# Patient Record
Sex: Female | Born: 1940
Health system: Southern US, Community
[De-identification: ages and names within clinical notes are randomized; demographics above are authoritative.]

## PROBLEM LIST (undated history)

## (undated) DIAGNOSIS — E079 Disorder of thyroid, unspecified: Secondary | ICD-10-CM

## (undated) DIAGNOSIS — I10 Essential (primary) hypertension: Secondary | ICD-10-CM

## (undated) DIAGNOSIS — G4730 Sleep apnea, unspecified: Secondary | ICD-10-CM

## (undated) DIAGNOSIS — E119 Type 2 diabetes mellitus without complications: Secondary | ICD-10-CM

## (undated) HISTORY — DX: Essential (primary) hypertension: I10

## (undated) HISTORY — PX: BREAST LUMPECTOMY: SHX2

## (undated) HISTORY — PX: OTHER SURGICAL HISTORY: SHX169

## (undated) HISTORY — DX: Type 2 diabetes mellitus without complications: E11.9

## (undated) HISTORY — PX: TOTAL ABDOMINAL HYSTERECTOMY: SHX209

## (undated) HISTORY — DX: Sleep apnea, unspecified: G47.30

## (undated) HISTORY — PX: CHOLECYSTECTOMY: SHX55

## (undated) HISTORY — DX: Disorder of thyroid, unspecified: E07.9

---

## 2000-12-01 ENCOUNTER — Encounter: Payer: Self-pay | Admitting: *Deleted

## 2000-12-01 ENCOUNTER — Encounter: Admission: RE | Admit: 2000-12-01 | Discharge: 2000-12-01 | Payer: Self-pay | Admitting: *Deleted

## 2000-12-04 ENCOUNTER — Encounter: Payer: Self-pay | Admitting: *Deleted

## 2000-12-04 ENCOUNTER — Encounter: Admission: RE | Admit: 2000-12-04 | Discharge: 2000-12-04 | Payer: Self-pay | Admitting: *Deleted

## 2000-12-17 ENCOUNTER — Encounter: Payer: Self-pay | Admitting: *Deleted

## 2000-12-17 ENCOUNTER — Encounter: Admission: RE | Admit: 2000-12-17 | Discharge: 2000-12-17 | Payer: Self-pay | Admitting: *Deleted

## 2003-03-28 ENCOUNTER — Ambulatory Visit: Admission: RE | Admit: 2003-03-28 | Discharge: 2003-03-28 | Payer: Self-pay | Admitting: Gynecology

## 2003-04-04 ENCOUNTER — Inpatient Hospital Stay (HOSPITAL_COMMUNITY): Admission: RE | Admit: 2003-04-04 | Discharge: 2003-04-07 | Payer: Self-pay | Admitting: Gynecology

## 2003-04-04 ENCOUNTER — Encounter (INDEPENDENT_AMBULATORY_CARE_PROVIDER_SITE_OTHER): Payer: Self-pay | Admitting: Specialist

## 2003-04-08 ENCOUNTER — Inpatient Hospital Stay (HOSPITAL_COMMUNITY): Admission: AD | Admit: 2003-04-08 | Discharge: 2003-04-13 | Payer: Self-pay | Admitting: Obstetrics & Gynecology

## 2003-04-21 ENCOUNTER — Emergency Department (HOSPITAL_COMMUNITY): Admission: EM | Admit: 2003-04-21 | Discharge: 2003-04-21 | Payer: Self-pay | Admitting: Emergency Medicine

## 2003-05-31 ENCOUNTER — Ambulatory Visit: Admission: RE | Admit: 2003-05-31 | Discharge: 2003-05-31 | Payer: Self-pay | Admitting: Gynecology

## 2003-08-28 ENCOUNTER — Inpatient Hospital Stay (HOSPITAL_COMMUNITY): Admission: AD | Admit: 2003-08-28 | Discharge: 2003-09-01 | Payer: Self-pay | Admitting: *Deleted

## 2003-08-29 ENCOUNTER — Encounter (INDEPENDENT_AMBULATORY_CARE_PROVIDER_SITE_OTHER): Payer: Self-pay | Admitting: *Deleted

## 2003-09-08 ENCOUNTER — Ambulatory Visit (HOSPITAL_COMMUNITY): Admission: RE | Admit: 2003-09-08 | Discharge: 2003-09-08 | Payer: Self-pay | Admitting: Gynecology

## 2003-09-13 ENCOUNTER — Other Ambulatory Visit: Admission: RE | Admit: 2003-09-13 | Discharge: 2003-09-13 | Payer: Self-pay | Admitting: Gynecology

## 2003-09-13 ENCOUNTER — Ambulatory Visit: Admission: RE | Admit: 2003-09-13 | Discharge: 2003-09-13 | Payer: Self-pay | Admitting: Gynecology

## 2003-09-13 ENCOUNTER — Encounter (INDEPENDENT_AMBULATORY_CARE_PROVIDER_SITE_OTHER): Payer: Self-pay | Admitting: *Deleted

## 2004-03-15 ENCOUNTER — Ambulatory Visit: Admission: RE | Admit: 2004-03-15 | Discharge: 2004-03-15 | Payer: Self-pay | Admitting: Gynecology

## 2004-03-15 ENCOUNTER — Encounter (INDEPENDENT_AMBULATORY_CARE_PROVIDER_SITE_OTHER): Payer: Self-pay | Admitting: *Deleted

## 2004-03-15 ENCOUNTER — Other Ambulatory Visit: Admission: RE | Admit: 2004-03-15 | Discharge: 2004-03-15 | Payer: Self-pay | Admitting: Gynecology

## 2004-10-29 ENCOUNTER — Encounter (INDEPENDENT_AMBULATORY_CARE_PROVIDER_SITE_OTHER): Payer: Self-pay | Admitting: Specialist

## 2004-10-29 ENCOUNTER — Ambulatory Visit: Admission: RE | Admit: 2004-10-29 | Discharge: 2004-10-29 | Payer: Self-pay | Admitting: Gynecologic Oncology

## 2004-10-29 ENCOUNTER — Other Ambulatory Visit: Admission: RE | Admit: 2004-10-29 | Discharge: 2004-10-29 | Payer: Self-pay | Admitting: Gynecologic Oncology

## 2005-04-14 IMAGING — CT CT ABDOMEN W/ CM
1 of 4 series · 13 of 32 positions shown, 18 images · IV contrast (omnipaque)
Comparison: none

CLINICAL DATA: Pelvic abscess.  Left hydronephrosis.  Endometrial cancer.  Fever.  
 CT SCAN OF THE ABDOMEN WITH CONTRAST:
 Spiral scanning is performed after oral administration of dilute contrast and during intravenous administration of 150 cc of Omnipaque 300. 
 Lung bases are clear except for minimal atelectasis at the bases, right more than left.  No pleural or pericardial fluid. 
 The liver shows mild fatty change, but there is no focal lesion.  The patient has had cholecystectomy.  Spleen is normal in size, shape and position.  No abnormality of the pancreas is seen.  The adrenal glands are normal.  The right kidney shows slight prominence of the renal collecting system and ureter which is probably nonobstructive.  There is a focal scar in the upper pole.  The left kidney shows moderate hydroureteronephrosis.  The cause of the obstruction is not evident on the abdominal portion of the scan.  No mass of the kidney is seen.  There is no aortic aneurysm.  The patient has had a retroperitoneal node dissection.  I do not see any measurable retroperitoneal lymph nodes.  
 IMPRESSION
 Moderate hydroureteronephrosis on the left of indeterminate etiology based on the abdominal portion of the scan.  See below.
 CT SCAN OF THE PELVIS WITH CONTRAST:
 Spiral scanning is performed after oral and intravenous contrast administration.  
 There is a large left retroperitoneal low density lesion located immediately adjacent to the psoas muscle running just ventral to the ureter from the level or the pelvic brim down to the level of the acetabulum.  This is responsible for the left ureteral obstruction.  This measures 7.5 cm cephalocaudal with a maximal transverse diameter of 8.5 x 6.5 cm.  This is consistent with a psoas or para-psoas abscess.  This displaces the bowel from left to right.  The bladder is displaced from left to right.  Posterior to this, there is a soft tissue density mass-like entity measuring 2.7 x 2.0 cm.  This appears nearly contiguous with the vaginal fornix.  The exact nature of this is uncertain.  It could represent a neoplastic mass.  No free fluid is seen.  No inguinal lymph nodes.  
 7.5 x 8.5 x 6.5 cm left psoas or para-psoas abscess with a mass effect upon the ureter which passes dorsal to this.  The ureter is very closely associated with the posterior margin.  This is presumed to represent an abscess, but could conceivably represent a hematoma, lymphocele, or necrotic tumor.  
 2 x 2.7 cm soft tissue density entity posterior to the psoas lesion and to the left of the sigmoid colon and just above the vaginal fornix on the left.  The exact nature of this is uncertain, but this could represent a malignant lymph node or tumor deposit.

[Series 2: abd/pelvis 5.0 b30f · axial · 0.74mm/px · z∈[-206,+220]mm · 13 of 97 slices shown, 18 images]
[im 6/97  soft-tissue]
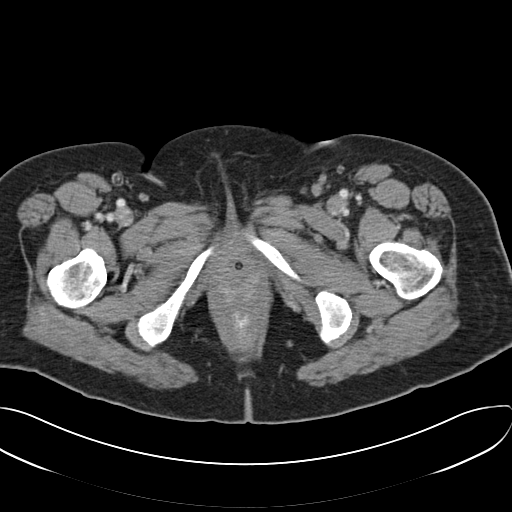
[im 6/97  bone]
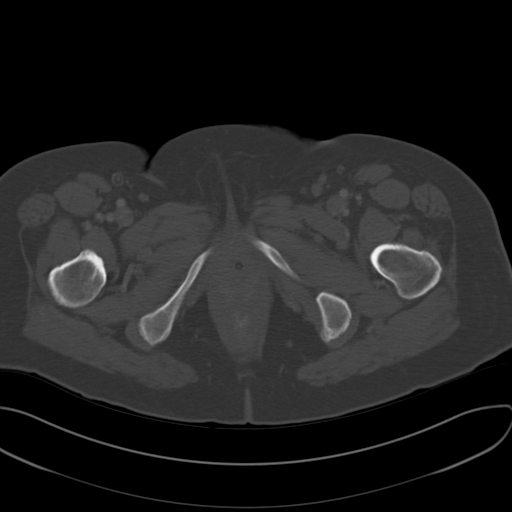
[im 17/97  soft-tissue]
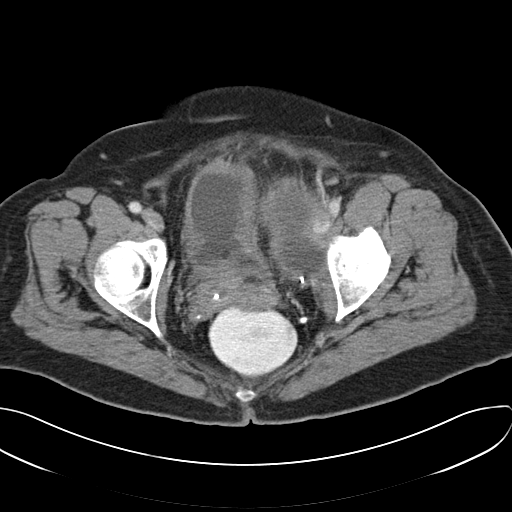
[im 23/97  soft-tissue]
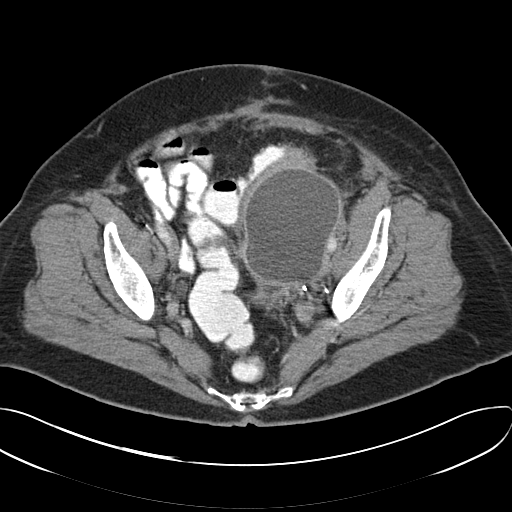
[im 29/97  soft-tissue]
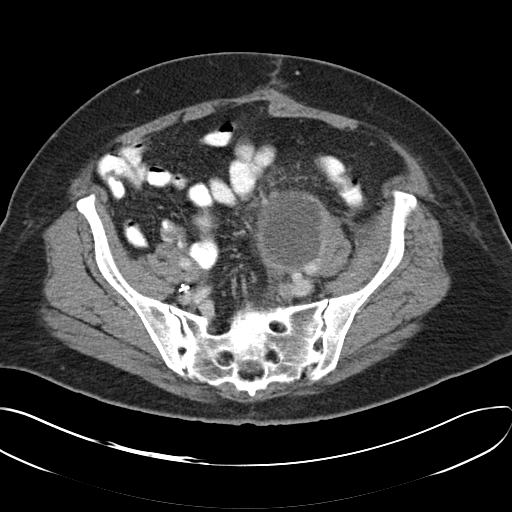
[im 40/97  soft-tissue]
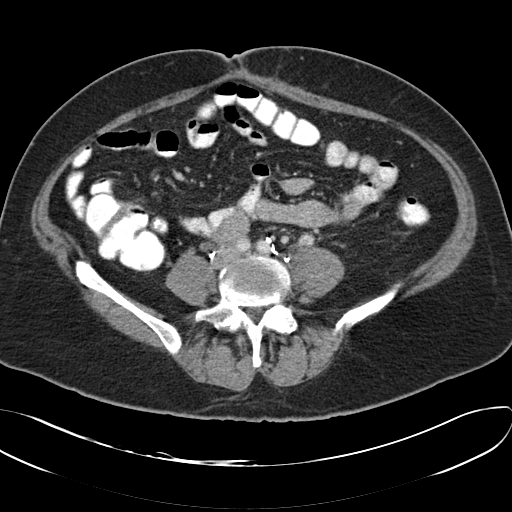
[im 46/97  soft-tissue]
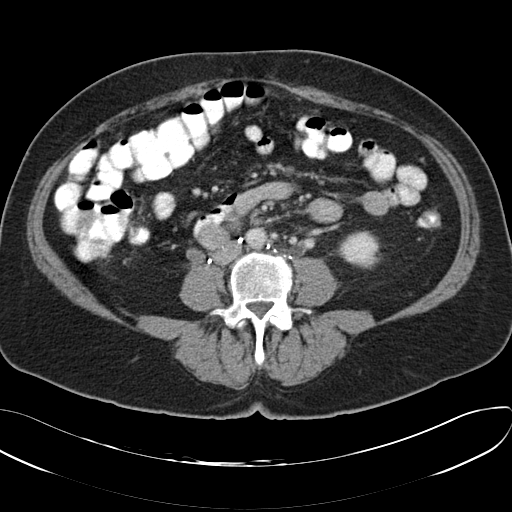
[im 51/97  soft-tissue]
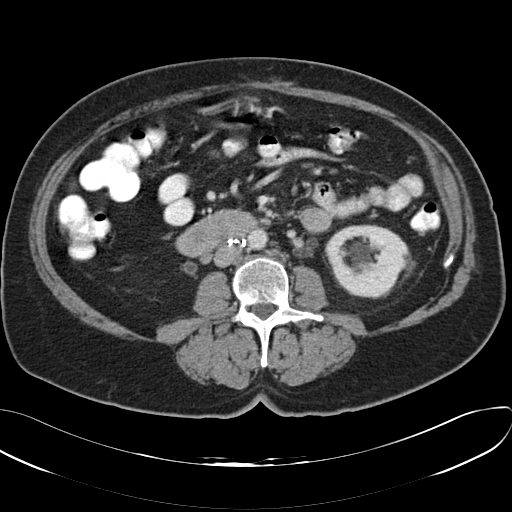
[im 63/97  soft-tissue]
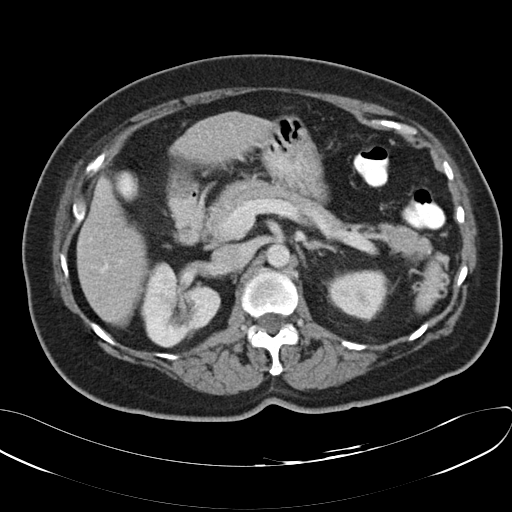
[im 68/97  soft-tissue]
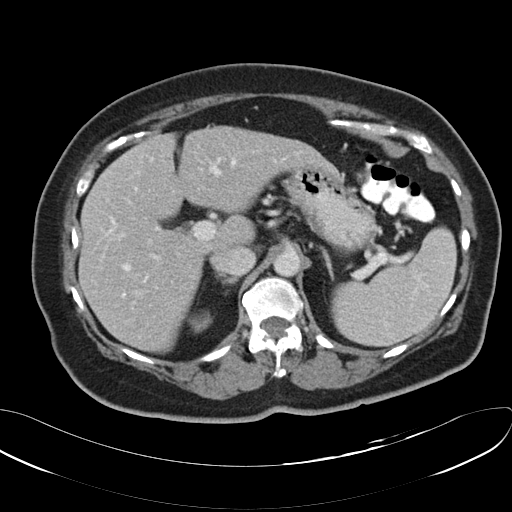
[im 68/97  bone]
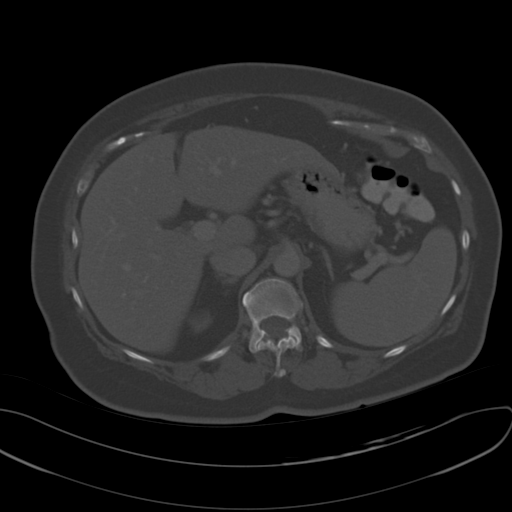
[im 74/97  soft-tissue]
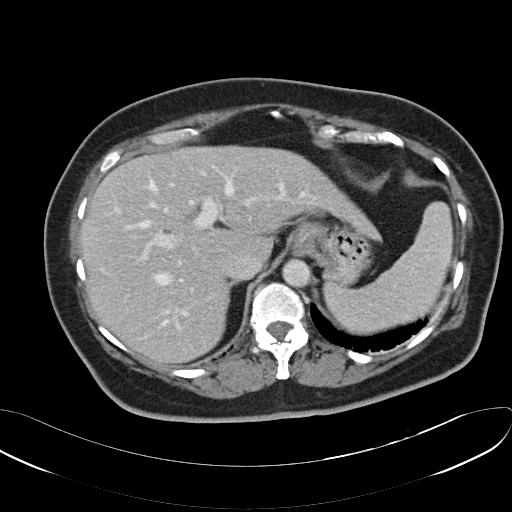
[im 74/97  lung]
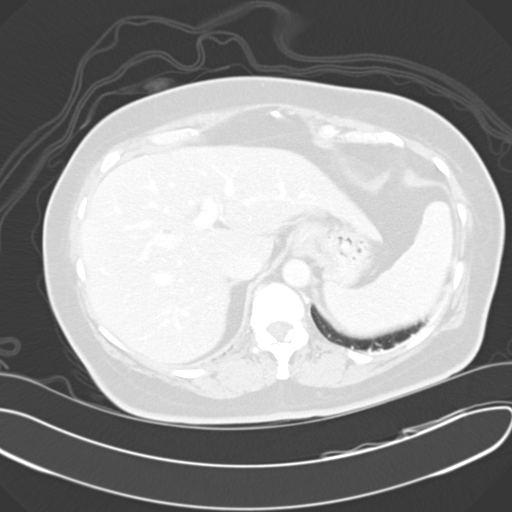
[im 80/97  lung]
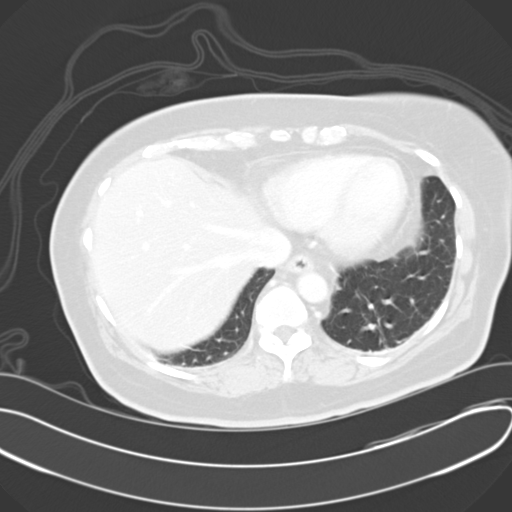
[im 85/97  soft-tissue]
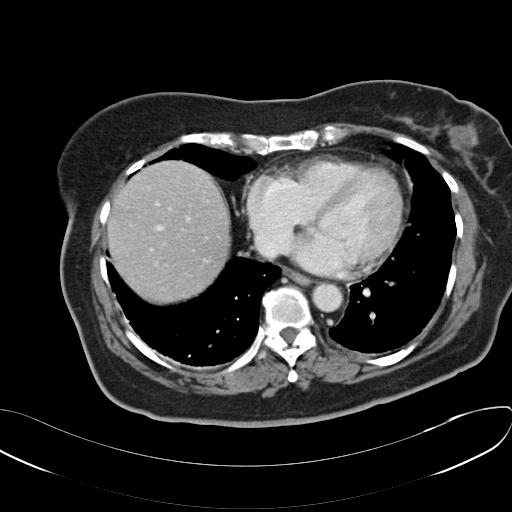
[im 85/97  lung]
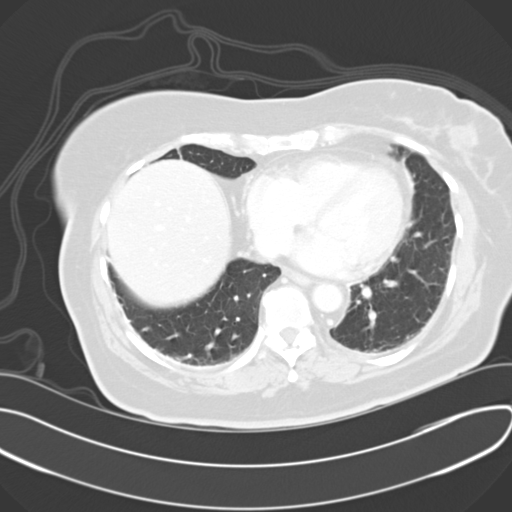
[im 91/97  soft-tissue]
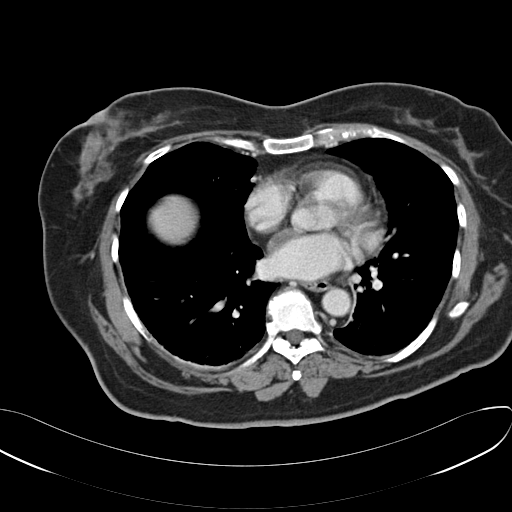
[im 91/97  lung]
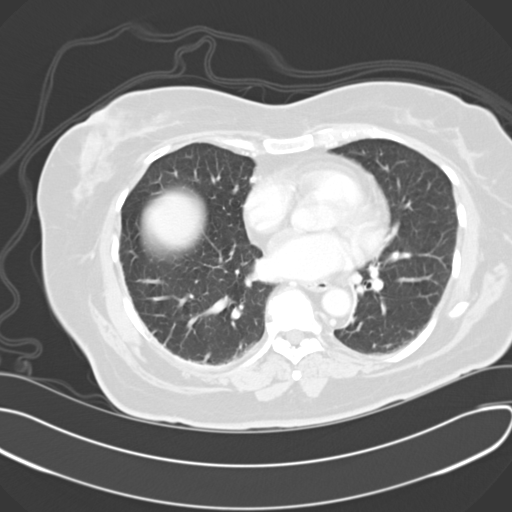

[13 of 32 positions shown; findings below may reference images not displayed]

## 2005-04-16 IMAGING — US US RETROPERITONEAL COMPLETE
1 series · 13 of 25 positions shown · non-contrast
Comparison: none

CLINICAL DATA: The patient has endometrial carcinoma and is status-post drainage of a left pelvic fluid collection under CT guidance on 08/29/03. Prior to that, CT has also demonstrated evidence of bilateral hydronephrosis and further follow-up ultrasound is now performed to assess for residual hydronephrosis as well as bladder volume.  
 RENAL/RETROPERITONEAL ULTRASOUND ? 08/31/03
 Right kidney measures 10.7 cm and has a normal appearance without any residual hydronephrosis noted.  No focal renal mass.  
 The left kidney measures 11 cm and also shows no evidence of residual hydronephrosis or focal abnormality.  
 Preliminary bladder images show an estimated volume of urine of approximately 127cc.  After voiding the bladder is seen to nearly completely empty with estimated post void residual volume of 25 cc.  
 Left lower pelvis area was also examined demonstrating near complete collapse of the drained left pelvic fluid collection. A small rim of fluid remains present around the visible drainage catheter.  
 IMPRESSION 
 Resolved hydronephrosis with no further hydronephrosis noted.  The kidneys have a normal appearance bilaterally.     Bladder volume reduced from 149 cc to 25 cc estimated volume after voiding.  Small amount of residual fluid remains around the inserted drainage catheter in the left pelvis.

[Series 1: unknown · 0.25mm/px · 13 of 39 slices shown]
[im 1/39]
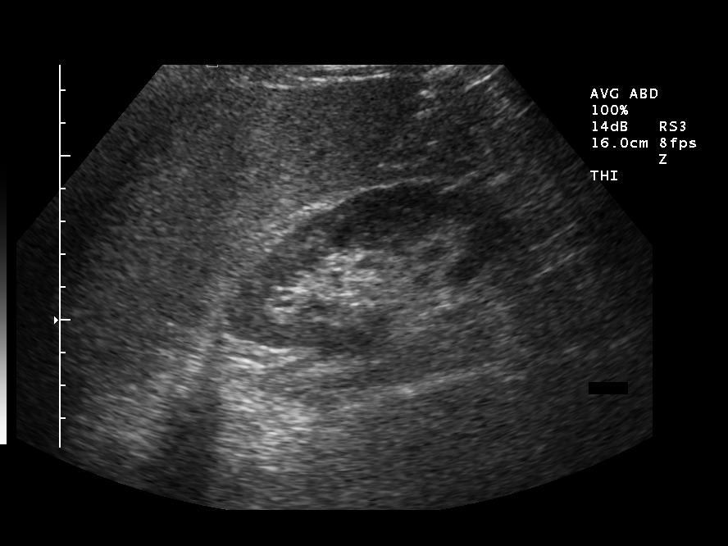
[im 4/39]
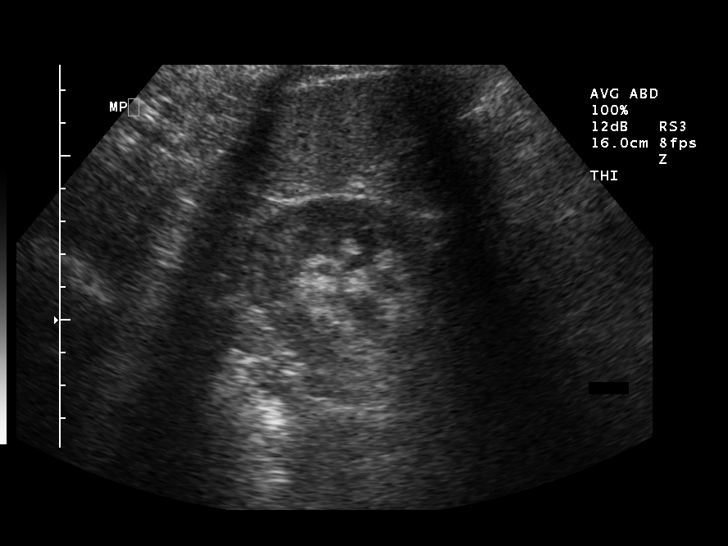
[im 7/39]
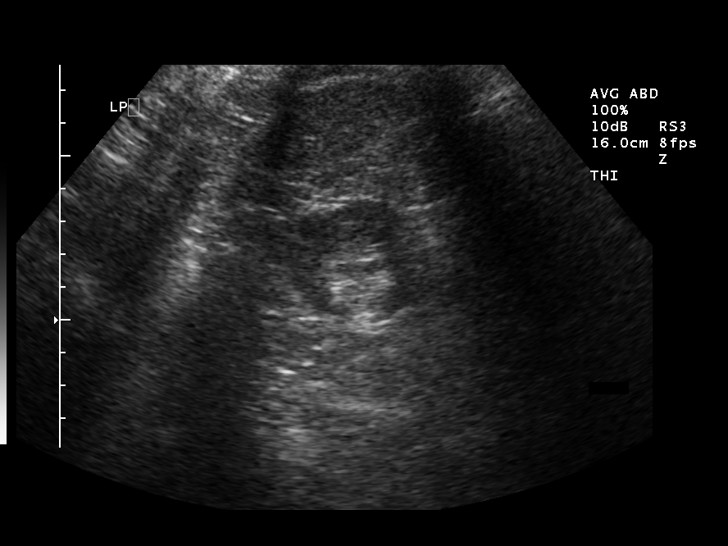
[im 10/39]
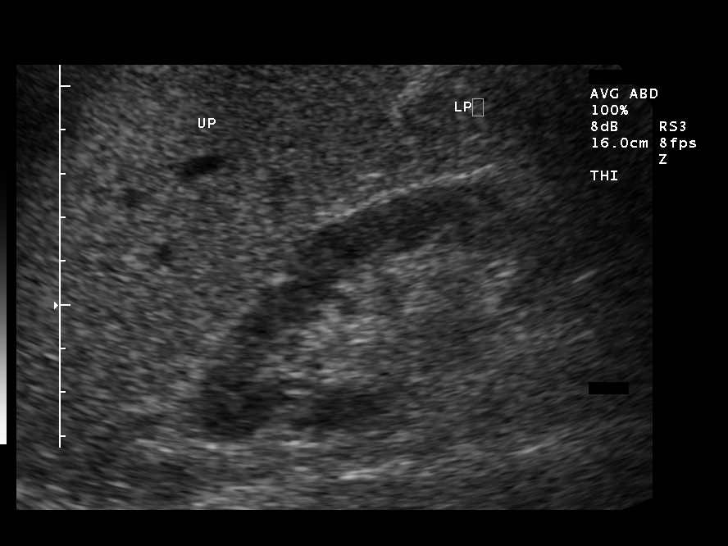
[im 13/39]
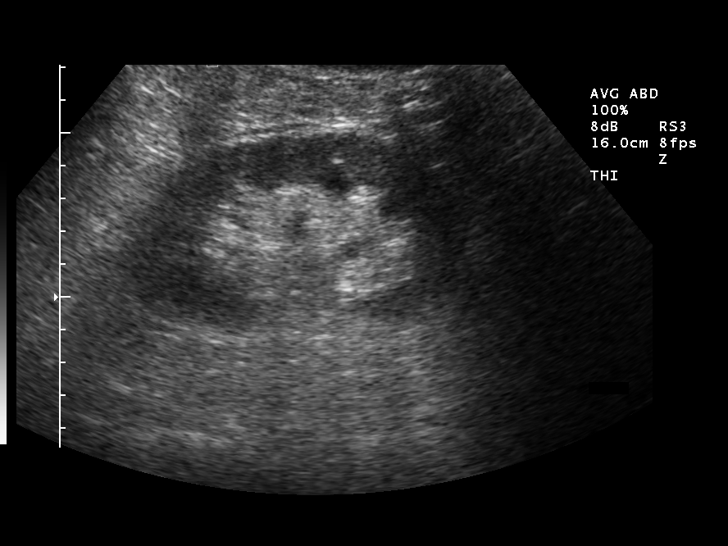
[im 16/39]
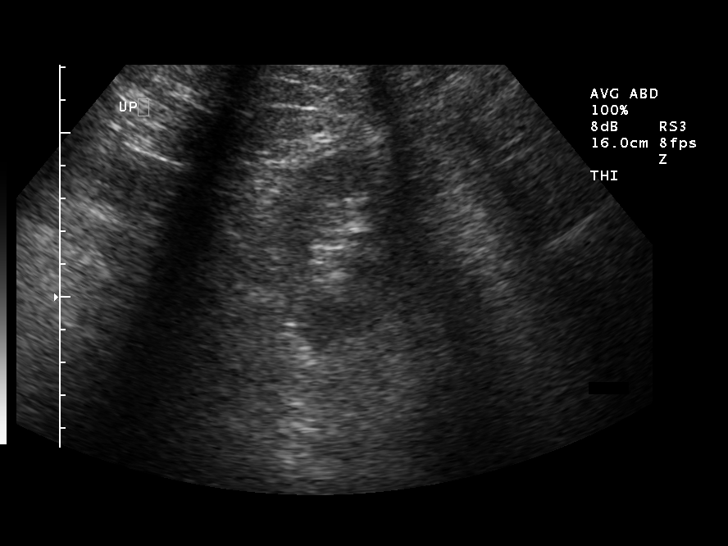
[im 20/39]
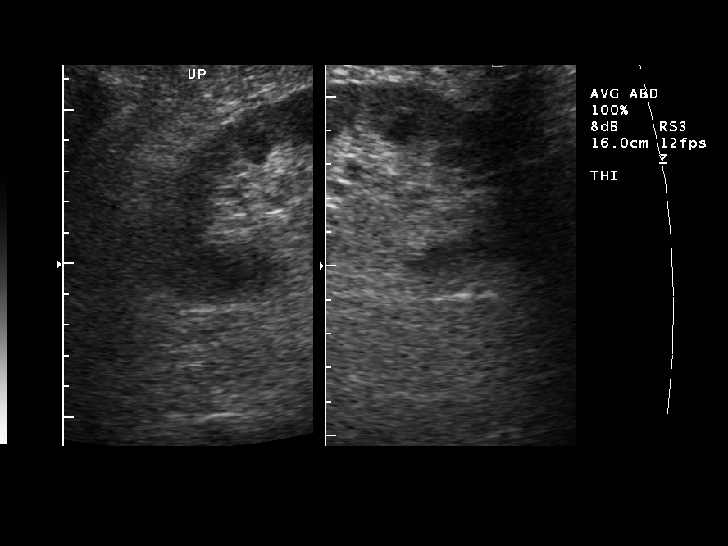
[im 23/39]
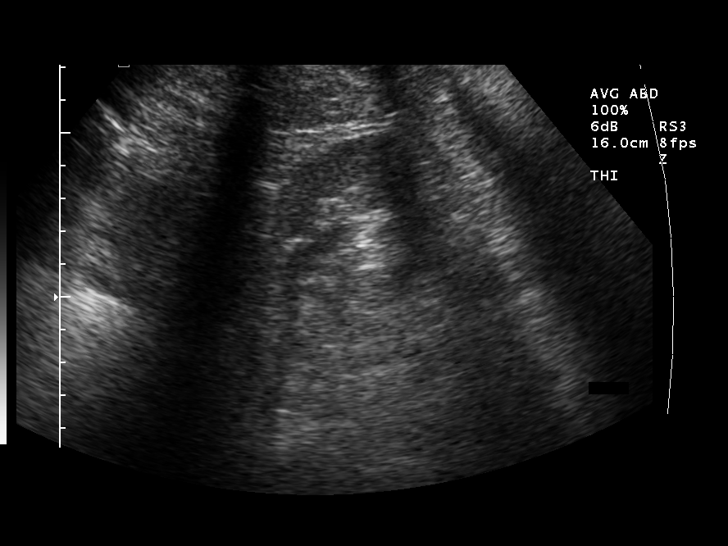
[im 26/39]
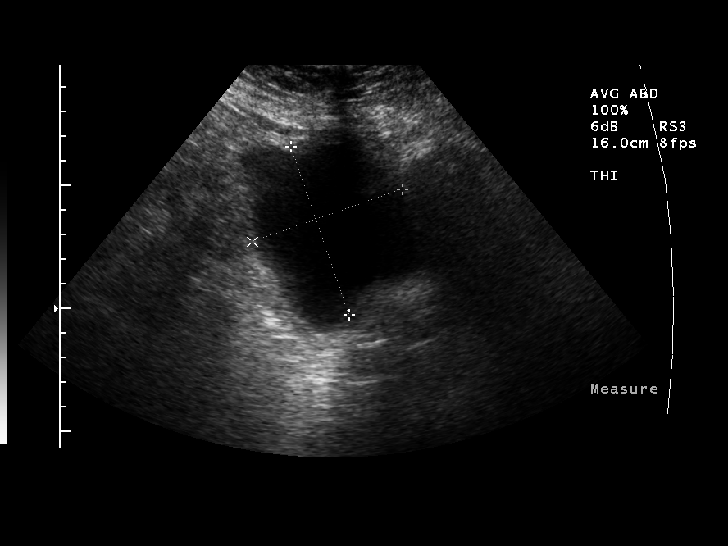
[im 29/39]
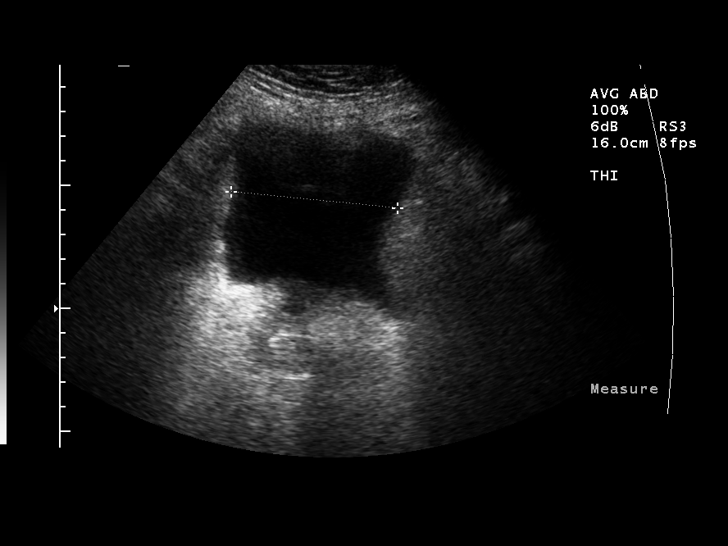
[im 32/39]
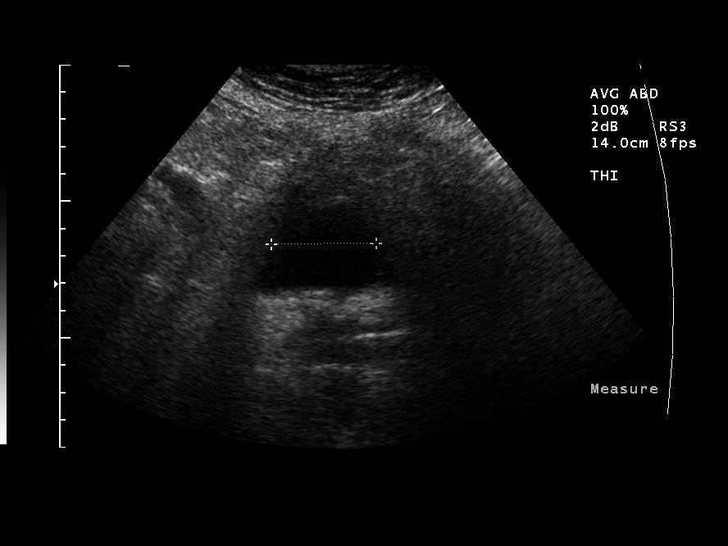
[im 35/39]
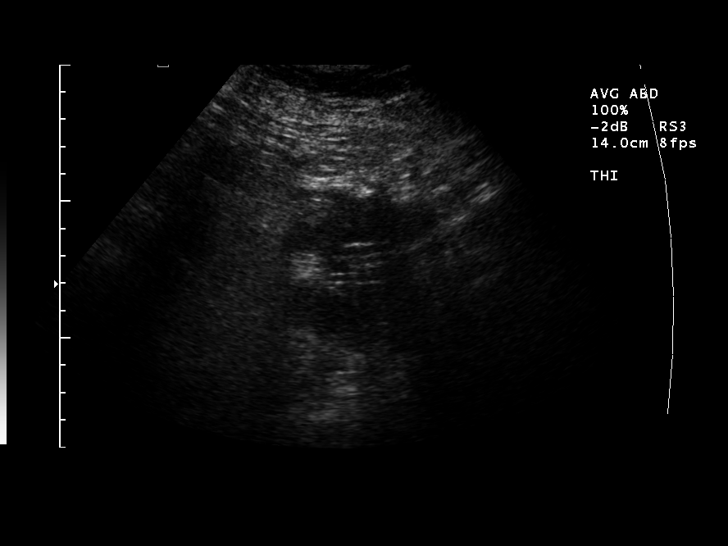
[im 39/39]
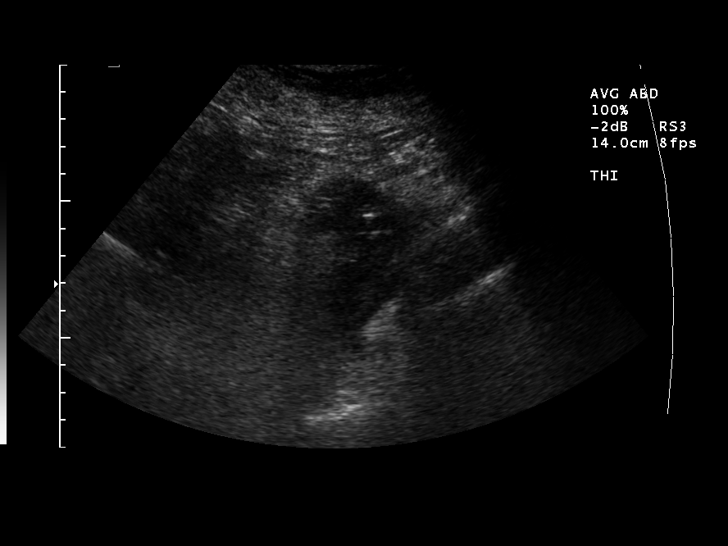

[13 of 25 positions shown; findings below may reference images not displayed]

## 2005-06-17 ENCOUNTER — Ambulatory Visit: Admission: RE | Admit: 2005-06-17 | Discharge: 2005-06-17 | Payer: Self-pay | Admitting: Gynecologic Oncology

## 2005-06-17 ENCOUNTER — Other Ambulatory Visit: Admission: RE | Admit: 2005-06-17 | Discharge: 2005-06-17 | Payer: Self-pay | Admitting: Gynecologic Oncology

## 2006-01-07 ENCOUNTER — Other Ambulatory Visit: Admission: RE | Admit: 2006-01-07 | Discharge: 2006-01-07 | Payer: Self-pay | Admitting: Gynecologic Oncology

## 2006-01-07 ENCOUNTER — Ambulatory Visit: Admission: RE | Admit: 2006-01-07 | Discharge: 2006-01-07 | Payer: Self-pay | Admitting: Gynecologic Oncology

## 2007-01-05 ENCOUNTER — Other Ambulatory Visit: Admission: RE | Admit: 2007-01-05 | Discharge: 2007-01-05 | Payer: Self-pay | Admitting: Gynecologic Oncology

## 2007-01-05 ENCOUNTER — Encounter (INDEPENDENT_AMBULATORY_CARE_PROVIDER_SITE_OTHER): Payer: Self-pay | Admitting: Gynecologic Oncology

## 2007-01-05 ENCOUNTER — Ambulatory Visit: Admission: RE | Admit: 2007-01-05 | Discharge: 2007-01-05 | Payer: Self-pay | Admitting: Gynecologic Oncology

## 2008-04-12 ENCOUNTER — Other Ambulatory Visit: Admission: RE | Admit: 2008-04-12 | Discharge: 2008-04-12 | Payer: Self-pay | Admitting: Gynecologic Oncology

## 2008-04-12 ENCOUNTER — Ambulatory Visit: Admission: RE | Admit: 2008-04-12 | Discharge: 2008-04-12 | Payer: Self-pay | Admitting: Gynecologic Oncology

## 2008-04-12 ENCOUNTER — Encounter (INDEPENDENT_AMBULATORY_CARE_PROVIDER_SITE_OTHER): Payer: Self-pay | Admitting: Gynecologic Oncology

## 2010-03-10 ENCOUNTER — Encounter: Payer: Self-pay | Admitting: Gynecology

## 2010-07-02 NOTE — Consult Note (Signed)
Kathy Ho, CISCO            ACCOUNT NO.:  [id number]   MEDICAL RECORD NO.:  [id number]          PATIENT TYPE:  OUT   LOCATION:  GYN                          FACILITY:  [facility]   PHYSICIAN:  John T. Clarene Essex, M.D.    DATE OF BIRTH:  [birth year]   DATE OF CONSULTATION:  04/12/2008  DATE OF DISCHARGE:                                 CONSULTATION   CHIEF COMPLAINT:  Follow up of endometrial cancer.   HISTORY OF PRESENT ILLNESS:  This patient underwent TAH/BSO and staging  in February of 2005.  She had stage IB, grade 2 endometrial cancer with  negative risk factors and negative nodes.  Left-sided lymphocyst drained  with negative cytology in 2005.  Follow up examinations through November  of 2008 have been normal, and she has had an interval examination by Dr.  Nyra Capes.  Since she was last seen, she denies pelvic pain, vaginal  bleeding, back pain or leg swelling.   PAST MEDICAL HISTORY:  1. Hypertension.  2. Hypothyroidism.  3. Seizure disorder.  4. Sleep apnea.  5. Prior open cystotomy for extraction of renal stone.  6. Open cholecystectomy.  7. Endometrial cancer surgery as above.   MEDICATIONS:  1. Avandia.  2. Niaspan.  3. Tricor.  4. Diovan.  5. Synthroid.  6. Baby aspirin.   ALLERGIES:  NEURONTIN.   FAMILY HISTORY:  Negative for gynecologic, breast or colon malignancy.   PERSONAL AND SOCIAL HISTORY:  Denies tobacco or ethanol.   REVIEW OF SYSTEMS:  Other than noted above, negative 10-system review.   PHYSICAL EXAMINATION:  VITAL SIGNS:  Weight stable at 174 pounds and  vital signs stable/afebrile.  GENERAL:  The patient is anxious, alert and oriented x3, in no acute  distress.  Pain score zero.  LYMPH SURVEY:  No pathologic lymphadenopathy.  BACK:  No spinous or CVA tenderness.  ABDOMEN:  Soft and benign with well-healed incisions.  No hernia,  ascites, mass or organomegaly, and no tenderness.  EXTREMITIES:  No cords, edema or Homans' sign.  PELVIC:  External  genitalia and BUS, bladder and urethra normal.  The  vaginal mucosa is atrophic without lesions.  Bimanual and rectovaginal  examinations reveal absent uterus and cervix with no mass or nodularity.   ASSESSMENT:  Endometrial cancer, no evidence of disease.   PLAN:  Pap smear obtained and will be communicated to the patient.  The  patient has no indications for scanning at this juncture.  We would  recommend annual follow up which could be obtained by Dr. Nyra Capes.  We  would be glad to see her at any time on a p.r.n. basis.  I discussed  hallmark symptoms concerning for recurrent endometrial cancer.  The  patient can be seen here at any time on a p.r.n. basis.      John T. Clarene Essex, M.D.  Electronically Signed     JTS/MEDQ  D:  04/12/2008  T:  04/12/2008  Job:  [id number]   cc:   Marco Collie, M.D.  [place]  fax [place]  Fax: [phone]

## 2010-07-02 NOTE — Consult Note (Signed)
Kathy Ho, Kathy Ho            ACCOUNT NO.:  [id number]   MEDICAL RECORD NO.:  [id number]          PATIENT TYPE:  OUT   LOCATION:  GYN                          FACILITY:  [facility]   PHYSICIAN:  John T. Clarene Essex, M.D.    DATE OF BIRTH:  10-01-1940   DATE OF CONSULTATION:  01/05/2007  DATE OF DISCHARGE:                                 CONSULTATION   CHIEF COMPLAINT:  Followup of endometrial cancer.   HISTORY OF PRESENT ILLNESS:  This patient underwent TAH/BSO and staging  in February 2005.  She had stage Ib, grade 2 endometrial cancer with  negative risk factors and negative nodes.  A left-sided lymphocyst was  successfully drained with negative cytology in July 2005.  Followup  evaluations to date have been without evidence of recurrence and the  last examination here was in November 2007.  The patient denies pelvic  pain, vaginal bleeding, back pain and leg swelling.   PAST MEDICAL HISTORY:  Significant for hypertension, hypothyroidism,  seizure disorder, sleep apnea.  She is status post open cystotomy for  extraction of renal stone, open cholecystectomy, and endometrial cancer  surgery as above.   CURRENT MEDICATIONS:  Avandia, Niaspan, Tricor, Diovan, Synthroid, and  baby aspirin.   ALLERGIES:  NEURONTIN.   FAMILY HISTORY:  Negative for gynecologic, breast or colon malignancy.   PERSONAL AND SOCIAL HISTORY:  Denies tobacco or ethanol.   REVIEW OF SYSTEMS:  Other than noted above, negative in the remainder of  10-system review.   EXAMINATION:  Weight 173 pounds and vital signs stable.  The patient is  anxious, alert and oriented x3.  LYMPH SURVEY:  No pathologic lymphadenopathy.  BACK:  No spinous or CVA tenderness.  ABDOMEN:  Soft and benign, well-healed incisions.  No hernia, ascites,  mass, or organomegaly.  EXTREMITIES:  No cyanosis, clubbing or edema with full strength and  range of motion.  PELVIC:  External genitalia and BUS normal to inspection and palpation.  Bladder and urethra are normal and vaginal mucosa is atrophic.  Bimanual  and rectovaginal examinations reveal absent uterus and cervix with no  mass or nodularity.   ASSESSMENT:  Endometrial cancer, no evidence of disease (NED).   PLAN:  The patient can continue to alternate followup between Dr. Nyra Capes  and myself such that she sees each of [place] at 11-month intervals.      John T. Clarene Essex, M.D.  Electronically Signed     JTS/MEDQ  D:  01/05/2007  T:  01/06/2007  Job:  [id number]   cc:   Caswell Corwin, R.N.  [address], [place] [address]   Marco Collie, MD  [facility]  [address], [place], [place] [address]   Kimberlee Nearing  Fax: [phone]

## 2010-07-05 NOTE — Consult Note (Signed)
NAME:  Kathy Ho, Kathy Ho            ACCOUNT NO.:  [id number]   MEDICAL RECORD NO.:  [id number]          PATIENT TYPE:  OUT   LOCATION:  GYN                          FACILITY:  [facility]   PHYSICIAN:  Kathy A. Alycia Rossetti, MD    DATE OF BIRTH:  15-Feb-1941   DATE OF CONSULTATION:  DATE OF DISCHARGE:                                   CONSULTATION   Kathy Ho is a 70 year old with a stage IB, grade II endometrial  adenocarcinoma, status post exploratory laparotomy, TAH/BSO, and appropriate  staging on February, 2005.  Postoperative pathology was consistent with a  grade II lesion with no evidence of lymphovascular space invasion.  There is  only 0.8 out of 2.2 cm.  Postoperatively, the patient developed a left-sided  lymphocyst which was successfully drained with negative cytology on August 29, 2003.  She was last seen by Kathy Ho in January, 2006 at which  time her exam and Pap smear were unremarkable.  She was subsequently seen by  her primary physician, Kathy Ho, in May, 2006, at which time her exam and  Pap smear were unremarkable.  In addition, she had bone density study that  showed low bone density but no osteopenia.  She comes in today for followup.   She is overall doing quite well.  She has a complaint of having some  cholesterol issues, particularly that her good cholesterol is low, although  her bad cholesterol is normal.  She has recently been started on some flax  seed oil and dietary changes.  There have been no other medication changes.   FAMILY HISTORY:  There is no new medical problems.   HEALTH MAINTENANCE:  She is getting due for her mammogram.   REVIEW OF SYSTEMS:  She is negative with the exception of some occasional  right mid abdominal discomfort, which she states that she experiences after  she lifts some weights or does exercise.  She otherwise denies any vaginal  bleeding, any change in bowel or bladder habits, any nausea, vomiting,  fevers, chills,  headaches, visual changes, chest pain, or shortness of  breath.   PHYSICAL EXAMINATION:  VITAL SIGNS:  Weight 182 pounds.  Blood pressure  118/72.  GENERAL:  A well-developed and well-nourished female in no acute distress.  NECK:  Supple.  There is no lymphadenopathy or thyromegaly.  LUNGS:  Clear to auscultation bilaterally.  CARDIOVASCULAR:  Regular rate and rhythm.  ABDOMEN:  She has a well-healed left-sided paramedian incision.  The abdomen  is soft and nontender.  There are no palpable masses, although the exam is  limited by habitus.  Groins are negative for adenopathy.  EXTREMITIES:  She has 1+ edema of her left ankle, compared to her right.  PELVIC:  External genitalia is within normal limits.  The vagina is  atrophic.  The vaginal cuff is visualized.  There are no visible lesions.  ThinPrep Pap was submitted without difficulty.  Bimanual examination reveals  no masses or nodularity.  Rectal confirms.   ASSESSMENT:  A 70 year old with stage IB grade endometrial adenocarcinoma,  no evidence for recurrent disease.   PLAN:  We will follow up on the results of her Pap smear for today.  She  will return to see [place] in eight months in the interim.  We will see Kathy Ho  in four months time.  She knows and was instructed that if the pain becomes  more persistent and more bothersome, she is to notify either [place] or Kathy Ho  so workup and evaluation can ensure.      Kathy A. Alycia Rossetti, MD  Electronically Signed     PAG/MEDQ  D:  10/29/2004  T:  10/29/2004  Job:  [id number]   cc:   Kimberlee Nearing  [address] [place]  [place] [address]  Fax: [phone]   Dr.  Ann Held  [facility]  [place], [place]   Caswell Corwin, R.N.  [phone], [place] [address]

## 2010-07-05 NOTE — Consult Note (Signed)
NAME:  Kathy Ho, Kathy Ho            ACCOUNT NO.:  [id number]   MEDICAL RECORD NO.:  [id number]          PATIENT TYPE:  OUT   LOCATION:  GYN                          FACILITY:  [facility]   PHYSICIAN:  Marti Sleigh, M.D.DATE OF BIRTH:  [birth year]   DATE OF CONSULTATION:  DATE OF DISCHARGE:                                   CONSULTATION   A 70 year old white female returns for continuing followup of endometrial  cancer.   INTERVAL HISTORY:  Since her last visit, the patient has done well.  She  denies any GI or GU symptoms.  Has no pelvic pain, pressure, vaginal  bleeding, or discharge.  She has had no further symptoms from her  lymphocyst.  She does not have any lymphedema.  Has no flank pain.   HISTORY OF PRESENT ILLNESS:  Patient underwent a total abdominal  hysterectomy and bilateral salpingo-oophorectomy and pelvic periaortic  lymphadenectomy on April 04, 2003.  She was found to have a grade II,  stage IB endometrial adenocarcinoma.  Her postoperative course was  complicated by a lymphocyst, which was obstructing the ureter.  Subsequently, this was drained, and the drain removed.  The patient has done  well since that time.   PAST MEDICAL HISTORY:  1.  Hypertension.  2.  Hypothyroidism.  3.  Seizure disorder.  4.  Sleep apnea, using CPAP every night.   PAST SURGICAL HISTORY:  1.  Extraction of renal stone by open cystotomy.  2.  Open cholecystectomy.  3.  TAH/BSO, pelvic and periaortic lymphadenectomy.   DRUG ALLERGIES:  None.   FAMILY HISTORY:  Negative for gynecologic, breast, or colon cancer.   CURRENT MEDICATIONS:  Synthroid, Diovan, aspirin, Centrum Silver, and flax  oil.   SOCIAL HISTORY:  The patient has recently entered an exercise program.   REVIEW OF SYSTEMS:  Otherwise negative.   PHYSICAL EXAMINATION:  VITAL SIGNS:  Weight 180 pounds.  Blood pressure  128/72.  GENERAL:  The patient is a healthy, obese white female in no acute distress.  HEENT:   Negative.  NECK:  Supple without thyromegaly.  There is no supraclavicular, axillary,  or inguinal adenopathy.  ABDOMEN:  Soft and nontender.  No mass, organomegaly, ascites, or hernias  are noted.  PELVIC:  EG/BUS, vagina, bladder, and urethra are normal.  The vaginal cuff  is well healed and well supported.  Pap smears are obtained.  Bimanual and  rectovaginal exam reveals no mass, induration, or nodularity.   IMPRESSION:  Stage IB, grade II endometrial cancer.  No evidence for  recurrent disease.   We obtained an ultrasound earlier today to assess her kidneys and make  certain there is no evidence of recurrent hydronephrosis.  The report is  that this all looks fine.   We will have the patient return to see her primary physician, Dr. Nicki Reaper, for  a pelvic exam in three months and return to see [place] in six months.      DC/MEDQ  D:  03/15/2004  T:  03/15/2004  Job:  [id number]   cc:   Jene Every, M.D.   Kimberlee Nearing  [address]  [place]  [place] [address]  Fax: [phone]   Dr. Ann Held  [facility]

## 2010-07-05 NOTE — Discharge Summary (Signed)
NAME:  Kathy Ho, Kathy Ho                      ACCOUNT NO.:  [id number]   MEDICAL RECORD NO.:  [id number]                   PATIENT TYPE:  INP   LOCATION:  [id number]                                 FACILITY:  [facility]   PHYSICIAN:  Agnes Lawrence, M.D.         DATE OF BIRTH:  Mar 18, 1940   DATE OF ADMISSION:  08/28/2003  DATE OF DISCHARGE:  09/01/2003                                 DISCHARGE SUMMARY   CHIEF COMPLAINT:  The patient is a 70 year old with a left-sided pelvic  mass, likely lymphocele, status post hysterectomy in February, 2005.  Please  see the dictated admission history and physical, as per Dr. Astrid Drafts,  for further details.   The patient was subsequently admitted.  She was given parenteral Unasyn for  a probable pyelonephritis.  A CT scan of the abdomen and pelvis demonstrated  a left pelvic fluid collection.  She subsequently underwent CT-guided  drainage of this pelvic collection.  A sample of the drainage was sent for  creatinine, culture, and cytology.  The creatinine was 1.1.  The culture was  gram-positive cocci with no growth initially.  Dr. Cherly Anderson. [facility]Pearson  of GYN oncology was consulted for further management of the patient.  Of  note, she had defervesced by July 13th.  On July 15th, the decision was made  to discharge the patient to home with the drain in situ.  The patient was  taught home care and drain management.  Her leukocytosis was improving.  As  well, her white count on the day of discharge was 11.6.  Blood cultures were  no growth.  Urine culture was no growth.  The final culture grew out a few  microaerophilic streptococci.  Cytology did not demonstrate malignant cells  from the drainage.  There was extensive acute inflammation and reactive  mesothelial cells.   DISCHARGE DIAGNOSIS:  Infected left-sided pelvic lymphocele.   CONDITION:  Stable.   DIET:  Regular.   ACTIVITY:  No strenuous activity.   SPECIAL INSTRUCTIONS:  She was  counseled again regarding drain management.  This included flushing the drain with 5 cc of saline twice daily and record  output daily.   Medications included resume home medications and Augmentin b.i.d.   DISPOSITION:  The patient is to follow up with Dr. Fermin Schwab on July 27  at 1:30 p.m., and she is to follow up interventional radiology when the  drainage from the drain was 2 cc or less.                                               Agnes Lawrence, M.D.    Howell Pringle  D:  10/19/2003  T:  10/20/2003  Job:  [id number]   cc:   Raelyn Mora., M.D.  [place]  [place]  [place] [address]  Fax: [place] Neptune, M.D.  [place] [place]  [place] [address]  Fax: [place] T. Kathlene Cote, M.D.  [address]  [place]  [place] [address]  Fax: [phone]   Caswell Corwin, R.N.  [phone] [address], [place] [address]

## 2010-07-05 NOTE — Consult Note (Signed)
NAME:  Kathy Ho, Kathy Ho                      ACCOUNT NO.:  [id number]   MEDICAL RECORD NO.:  [id number]                   PATIENT TYPE:  OUT   LOCATION:  GYN                                  FACILITY:  [facility]   PHYSICIAN:  Marti Sleigh, M.D.         DATE OF BIRTH:  1940-11-30   DATE OF CONSULTATION:  05/31/2003  DATE OF DISCHARGE:                                   CONSULTATION   REASON FOR CONSULTATION:  A 70 year old white female returns for  postoperative check having undergone TAHBSO, and pelvic and periaortic  lymphadenectomy on April 04, 2003.  Final pathology showed that she had a  stage IB grade 2 endometrial carcinoma.  No adjuvant therapy has been  recommended.  The patient reports she is doing well.  She has a minimal  amount of abdominal discomfort.  She has some constipation.  She denies any  pelvic pain, pressure, vaginal bleeding, or discharge.   PHYSICAL EXAMINATION:  VITAL SIGNS:  Weight 173 pounds, blood pressure  130/80.  GENERAL:  The patient is a healthy white female in no acute distress.  HEENT:  Negative.  NECK:  Supple without thyromegaly.  ABDOMEN:  Soft, nontender.  A midline incision is healing well.  No masses,  organomegaly, ascites, or hernias are noted.  PELVIC:  EGBUS, vagina, bladder, urethra are normal.  The cuff is healing  well.  Cervix and uterus surgically absent.  Adnexa without masses.  Rectovaginal exam confirms.   IMPRESSION:  Stage IB grade 2 endometrial adenocarcinoma.   The patient has had a good postoperative recovery and can return to full  levels of activity.   PLAN:  The patient will return to see Dr. Kimberlee Nearing for a checkup in 3  months and return to see [place] in 6 months.                                               Marti Sleigh, M.D.    DC/MEDQ  D:  05/31/2003  T:  05/31/2003  Job:  [id number]   cc:   Kimberlee Nearing  [address] [place]  [place] [address]  Fax: [phone]   Caswell Corwin,  R.N.  [phone] [address], [place] [address]

## 2010-07-05 NOTE — Discharge Summary (Signed)
NAME:  Kathy Ho, Kathy Ho                      ACCOUNT NO.:  [id number]   MEDICAL RECORD NO.:  [id number]                   PATIENT TYPE:  INP   LOCATION:  [id number]                                 FACILITY:  [facility]   PHYSICIAN:  Agnes Lawrence, M.D.         DATE OF BIRTH:  [birth year]   DATE OF ADMISSION:  04/04/2003  DATE OF DISCHARGE:  04/07/2003                                 DISCHARGE SUMMARY   CHIEF COMPLAINT:  The patient is a 70 year old Caucasian female with a grade  2 endometrial cancer who presents for operative intervention.  Please see  the dictated history and physical as per Dr. Quillian Quince L. Clarke-Pearson for  further details.   HOSPITAL COURSE:  The patient was admitted and underwent a total abdominal  hysterectomy and bilateral salpingo-oophorectomy with pelvic and para-aortic  lymphadenectomies.  Please see the dictated operative summary for further  details.  The patient's postoperative course was remarkable for scant  vaginal bleeding on postoperative day #2; that resolved.  She was discharged  to home on postoperative day #3, tolerating a regular diet.   DISCHARGE DIAGNOSES:  Stage I, grade 2 endometrial adenocarcinoma.   PROCEDURES:  Total abdominal hysterectomy, bilateral salpingo-oophorectomy,  pelvic and para-aortic lymphadenectomies.   CONDITION:  Good.   DIET:  Regular.   ACTIVITY:  No strenuous activity.   MEDICATIONS:  1. Resume home medications.  2. Percocet 1-2 tablets every 6 hours as needed.   DISPOSITION:  The patient was to follow up at the GYN oncology office on  April 10, 2003, at noon, for staple removal.                                               Agnes Lawrence, M.D.    Howell Pringle  D:  04/13/2003  T:  04/13/2003  Job:  [id number]   cc:   Kimberlee Nearing  [address] [place]  [place] [address]  Fax: [phone]   Caswell Corwin, R.N.  [phone] [place], [place] [address]

## 2010-07-05 NOTE — Consult Note (Signed)
Kathy Ho, Kathy Ho            ACCOUNT NO.:  [id number]   MEDICAL RECORD NO.:  [id number]          PATIENT TYPE:  OUT   LOCATION:  GYN                          FACILITY:  [facility]   PHYSICIAN:  John T. Clarene Essex, M.D.    DATE OF BIRTH:  Jun 02, 1940   DATE OF CONSULTATION:  01/07/2006  DATE OF DISCHARGE:                                 CONSULTATION   CHIEF COMPLAINT:  Follow-up of endometrial cancer.   HISTORY OF PRESENT ILLNESS:  The patient underwent TAH/BSO and staging  in February 2005 with a stage IB, grade 2 endometrial adenocarcinoma, no  lymph vascular space involvement and negative nodes.  She had left-sided  lymphocyst successfully drained with negative cytology in July 2005.  Follow-ups to date have been without evidence of recurrence, last  examination here in May 2007.  The patient denies pelvic pain, vaginal  bleeding, back pain or leg swelling.   PAST MEDICAL HISTORY:  Is significant for:  1. Hypertension.  2. Hypothyroidism.  3. Seizure disorder.  4. Sleep apnea.  5. Status post open cystotomy for extraction of renal stone.  6. Open cholecystectomy.  7. Endometrial cancer surgery as above.   CURRENT MEDICATIONS:  1. Avandia.  2. Niaspan.  3. TriCor.  4. Diovan.  5. Synthroid.  6. Baby aspirin.   ALLERGIES:  NEURONTIN.   FAMILY HISTORY:  Negative for gynecologic, breast or colon malignancy.   PERSONAL AND SOCIAL HISTORY:  Denies tobacco or ethanol.   REVIEW OF SYSTEMS:  Other than noted above, the patient is negative in  the remainder of 10 systems.   PHYSICAL EXAMINATION:  Weight 168 pounds (decreased). Vital signs stable  and afebrile.  The patient is anxious, alert and oriented x3 in no acute distress.  There is no pathologic lymphadenopathy.  Lung fields clear.  LYMPHATICS:  Negative for pathologic lymphadenopathy on survey.  ABDOMEN:  Soft and benign with well-healed incisions.  No ascites,  hernia, mass or tenderness.  EXTREMITIES:  Trace edema of  the left ankle with negative Homans and no  cords.  PELVIC:  External genitalia and BUS normal.  Vagina atrophic with normal  BUS.  The cuff is clear.  Bimanual and rectovaginal examinations reveal  absent uterus and cervix with no mass or nodularity.   ASSESSMENT:  Stage IB grade 2 endometrial adenocarcinoma, NAD.   PLAN:  The patient can have interval cytology with Dr. Nyra Capes in  [place] and can return to see [place] in one year.  After five years' of  follow-up, she can be seen at annual intervals.      John T. Clarene Essex, M.D.  Electronically Signed     JTS/MEDQ  D:  01/07/2006  T:  01/07/2006  Job:  [id number]   cc:   Kimberlee Nearing  [phone]   Marco Collie, M.D.  Family Practice  [place], [place]   Caswell Corwin, R.N.  [phone] [address] [place], [place] [address]

## 2010-07-05 NOTE — Consult Note (Signed)
NAME:  Kathy Ho, Kathy Ho                      ACCOUNT NO.:  [id number]   MEDICAL RECORD NO.:  [id number]                   PATIENT TYPE:  OUT   LOCATION:  GYN                                  FACILITY:  [facility]   PHYSICIAN:  Marti Sleigh, M.D.         DATE OF BIRTH:  [birth year]   DATE OF CONSULTATION:  03/28/2003  DATE OF DISCHARGE:                                   CONSULTATION   HISTORY OF PRESENT ILLNESS:  A 70 year old white married female seen in  consultation at the request of Dr. Kimberlee Nearing of [place], [place].  The patient presented with abnormal uterine bleeding.  Initial biopsy showed  complex endometrial hyperplasia without atypia.  Hysteroscopy and D&C were  subsequently performed on March 10, 2003, which revealed a grade 2  endometrial adenocarcinoma.  The patient continues to have a slight amount  of spotting.  She denies any pelvic pain, pressure, or any constitutional  symptoms.   PAST MEDICAL HISTORY:  1. Hypertension.  2. Hypothyroidism.  3. History of seizure disorder.  The patient is currently off Dilantin.  4. Sleep apnea, using a CPAP every night.   PAST SURGICAL HISTORY:  Extraction of renal stone by open cystotomy, open  cholecystectomy.   DRUG ALLERGIES:  None.   FAMILY HISTORY:  Negative for gynecologic, breast, or colon cancers.   CURRENT MEDICATIONS:  1. Synthroid.  2. Diovan every other day.  3. Aspirin.  4. Centrum Silver.  (The patient discontinued Dilantin 3 months ago at her doctor's advice; she  has had no seizures since that time.  She gives a history of  having only 3  seizures in her entire life.)   PHYSICAL EXAMINATION:  VITAL SIGNS:  Weight 179 pounds.  Blood pressure  140/74, pulse 72, respiratory rate 18.  GENERAL:  The patient is a healthy white female in no acute distress.  HEENT:  Negative.  NECK:  Supple without thyromegaly.  There is no supraclavicular or inguinal  adenopathy.  ABDOMEN:  Obese, soft,  nontender.  No masses, organomegaly, or hernias  noted.  She has a well-healed Pfannenstiel incision, as well as a right  subcostal incision.  PELVIC:  EGBUS, vagina, bladder, urethra are normal.  Cervix is normal.  No  lesions are noted in the vagina or cervix.  Bimanual exam reveals a uterus  which is approximately [redacted] weeks gestational size.  There are no adnexal  masses noted.  RECTOVAGINAL:  Confirms.   IMPRESSION:  Grade 2 endometrial cancer.   The patient has had a CT scan which shows some right hydronephrosis  secondary to extrinsic compression from the uterus.  No evidence of  metastatic disease is found.   PLAN:  Would recommend the patient undergo a total abdominal hysterectomy  and bilateral salpingo-oophorectomy, pelvic and periaortic lymphadenectomy.  The risks of surgery, including hemorrhage, infection, injury to adjacent  __________, or thromboembolic complications were outlined.  The patient is  aware that she may  require postoperative radiation therapy, depending upon  prognostic features.   We also discussed with the patient participate in GOG protocol 210.  She is  in agreement, and the protocol nurse will see her later today.  We will go  ahead and schedule surgery for next week with Dr. Lahoma Crocker  assisting.                                               Marti Sleigh, M.D.    DC/MEDQ  D:  03/28/2003  T:  03/28/2003  Job:  [id number]   cc:   Kimberlee Nearing  [address] [place]  [place] [address]  Fax: [phone]   Caswell Corwin, R.N.  [phone] [address]  [place], [place] [address]   Agnes Lawrence, M.D.  [address]    [place] [address]  Fax: [phone]

## 2010-07-05 NOTE — Discharge Summary (Signed)
NAME:  Kathy Ho, Kathy Ho                      ACCOUNT NO.:  [id number]   MEDICAL RECORD NO.:  [id number]                   PATIENT TYPE:  INP   LOCATION:  9311                                 FACILITY:  [facility]   PHYSICIAN:  Agnes Lawrence, M.D.         DATE OF BIRTH:  17-May-1940   DATE OF ADMISSION:  04/08/2003  DATE OF DISCHARGE:  04/13/2003                                 DISCHARGE SUMMARY   CHIEF COMPLAINT:  The patient is a 70 year old postoperative day four status  post total abdominal hysterectomy and bilateral salpingo-oophorectomy with  pelvic and aortic lymphadenectomies for a stage I endometrial cancer who  presents with nausea and vomiting.   HISTORY OF PRESENT ILLNESS:  As above.  The patient reports flatus.  She  denies any fever or abdominal pain.   MEDICATIONS ON ADMISSION:  1. Synthroid.  2. Diovan.  3. Aspirin.  4. Centrum Silver.   ALLERGIES:  1. NEURONTIN.   PAST OB/GYN HISTORY:  As above.  She is status post three normal spontaneous  vaginal deliveries.   PAST MEDICAL HISTORY:  1. Chronic hypertension.  2. Hypothyroidism.  3. Seizure disorder.  4. Sleep apnea.   PAST SURGICAL HISTORY:  1. See above.  2. Status post extraction of renal stones.  3. Open cholecystectomy.   SOCIAL HISTORY:  Noncontributory.   PHYSICAL EXAMINATION ON ADMISSION:  VITAL SIGNS:  Temperature 99.4, pulse  98, respiratory rate 18, blood pressure 154/81.  GENERAL APPEARANCE:  No acute distress.  LUNGS:  Clear to auscultation bilaterally.  HEART:  Regular rate and rhythm.  ABDOMEN:  Hypoactive bowel sounds throughout, softly distended and non-  tender.  The incision is clean, dry and intact.  EXTREMITIES:  Non-tender.   LABORATORY DATA:  Abdominal x-ray is consistent with an ileus.  Metabolic  profile is normal.  White count is 15,700 and hemoglobin 13.7.   ASSESSMENT:  1. Postoperative ileus with mild leukocytosis.  2. Multiple medical problems.   PLAN:   Admission, IV hydration, bowel rest, serial laboratories.   HOSPITAL COURSE:  The patient was admitted.  She was made NPO.  On hospital  day number one she had a bowel movement and her white blood cell count was  10,900.  Her basic metabolic profile again was within normal limits.  On  hospital day number two she was started on a liquid diet. She continued to  improve.  She had a mild hypokalemia on hospital day number four with a  potassium of 3.3.  She was discharged to home on hospital day number five  tolerating a regular diet.   DISCHARGE DIAGNOSIS:  Postoperative ileus.   DISCHARGE CONDITION:  Good.   DIET:  Regular.   ACTIVITY:  No strenuous activity and pelvic rest.   DISCHARGE MEDICATIONS:  Resume home medications.   DISPOSITION:  She is to keep her follow-up appointment with the GYN oncology  office.  Agnes Lawrence, M.D.    LAJ/MEDQ  D:  05/02/2003  T:  05/04/2003  Job:  [id number]   cc:   Caswell Corwin, R.N.  [address], [place] [address]

## 2010-07-05 NOTE — Consult Note (Signed)
NAME:  Kathy Ho, Kathy Ho                      ACCOUNT NO.:  [id number]   MEDICAL RECORD NO.:  [id number]                   PATIENT TYPE:  OUT   LOCATION:  GYN                                  FACILITY:  [facility]   PHYSICIAN:  Marti Sleigh, M.D.         DATE OF BIRTH:  [birth year]   DATE OF CONSULTATION:  09/13/2003  DATE OF DISCHARGE:                                   CONSULTATION   REASON FOR CONSULTATION:  A 70 year old white female returns for continuing  followup of a stage IB grade 2 endometrial cancer.  Recently, she presented  with abdominal pain and a fever and was found to have a lymphocyst  obstructing her right ureter.  The lymphocyst was drained.  Cytology shows  no evidence of malignant disease.  Recently, the drain has been removed  after the lymphocyst was collapsed.   Currently, the patient feels well.  She denies any pelvic pain, pressure, GI  or GU symptoms, and has no flank pain.  She denies any fevers.   PAST MEDICAL HISTORY:  1. Hypertension.  2. Hypothyroidism.  3. History of seizure disorder.  4. Sleep apnea, using CPAP every night.   PAST SURGICAL HISTORY:  1. Extraction of renal stone by open cystotomy.  2. Open cholecystectomy.   ALLERGIES:  No known drug allergies.   FAMILY HISTORY:  Negative for gynecologic, breast, or colon cancers.   CURRENT MEDICATIONS:  1. Synthroid.  2. Diovan.  3. Aspirin.  4. Centrum Silver.   PHYSICAL EXAMINATION:  VITAL SIGNS:  Weight 170 pounds, blood pressure  130/80.  GENERAL:  The patient is a healthy white female in no acute distress.  HEENT:  Negative.  NECK:  Supple without thyromegaly.  ABDOMEN:  Soft and nontender.  No masses, organomegaly, ascites, or hernias  are noted.  PELVIC:  EGBUS, vagina, bladder, and urethra are normal.  Vaginal cuff is  well-healed.  No lesions are noted.  Bimanual and rectovaginal examination  reveal no masses, induration, or nodularity.   Pap smear is repeated.   IMPRESSION:  Stage IB grade 2 endometrial adenocarcinoma.  The patient is  doing well, having recently had a lymphocyst drained.   PLAN:  Pap smears are obtained.  The patient will return to see Dr. Kimberlee Nearing in three months, and return to see [place] in six months.                                               Marti Sleigh, M.D.    DC/MEDQ  D:  09/13/2003  T:  09/13/2003  Job:  [id number]   cc:   Kimberlee Nearing  [address] [place]  [place] [address]  Fax: [phone]   Caswell Corwin, R.N.  [phone] [address]  [place], [place] [address]   Ann Held, M.D.  [place], [place]

## 2010-07-05 NOTE — H&P (Signed)
NAME:  Kathy Ho, Kathy Ho                      ACCOUNT NO.:  [id number]   MEDICAL RECORD NO.:  [id number]                   PATIENT TYPE:  INP   LOCATION:  [id number]                                 FACILITY:  [facility]   PHYSICIAN:  Raelyn Mora., M.D.            DATE OF BIRTH:  Apr 19, 1940   DATE OF ADMISSION:  08/28/2003  DATE OF DISCHARGE:                                HISTORY & PHYSICAL   This 70 year old female presented in our office on August 28, 2003 complaining  of pain in the left side associated with fever, onset August 22, 2003.   HISTORY OF PRESENT ILLNESS:  The patient had a history of a right ureteral  lithotomy in 1964 and in February, 2005, she had a radical hysterectomy done  by Dr. Fermin Schwab for endometrial uterine cancer with her nodes being  negative and her peritoneal washings also negative for malignant cells.  Patient had some difficulty with ileus postop lately but after that cleared,  she did well until the present illness.  She has had a temperature of 100.9.  She has had some nausea and vomiting on at least one occasion.  She went to  the emergency room at [facility] and was started on antibiotics,  including amoxicillin, clavulanate, metronidazole, Cipro, and has continued  to have pain in the left side and low-grade fever.  When seen in our office,  ultrasound examination suggested a left hydronephrosis and a cystic mass in  the pelvis, which did not resolve when a Foley catheter was inserted.  For  that reason, a CT scan was obtained, and a cystic mass in the left side of  the pelvis was confirmed.  This was thought to be an infected lymphocele or  pelvic abscess by Dr. Lawerance Bach and myself.  For that reason, the patient  was admitted and Dr. Fermin Schwab was contacted for consultation.  Arrangements were made for drainage of the mass.   ALLERGIES:  NEURONTIN.   Present medications include ibuprofen, amoxicillin, clavulanate, Cipro,  metronidazole.  She was not taking anything for pain.   PAST MEDICAL HISTORY:  She has had hypertension.  She had seizures in the  past and was on Dilantin but recently saw Dr. Erling Cruz, and he took her the  Dilantin.  She has had back pain in the past and stones as well as her  uterine endometrial carcinoma.  She was evaluated in 2002 for right-sided  pain.  No stone was demonstrated at that time on CT scan and IVP.   PAST SURGICAL HISTORY:  The patient has had a right ureteral lithotomy in  1964 and a radical hysterectomy by Dr. Fermin Schwab in February, 2005.  In  1973, she had gallbladder surgery without complication.   SOCIAL HISTORY:  She is married.  No tobacco.  No alcohol.   FAMILY HISTORY:  Positive for diabetes and hypertension.   REVIEW OF SYSTEMS:  General health has been fair until the  present illness.  HEENT:  Unremarkable.  CARDIORESPIRATORY:  She has had a little cough but no  phlegm.  GI:  She has vomited once.  Had nausea.  BONES/JOINTS/MUSCLES:  She  has noted a little swelling of her left leg.  NEURO/PSYCHIATRIC:  No more  seizures since she stopped her Dilantin.  HEMATOPOIETIC:  No lumps noted.  SKIN:  No lesions noted.  Stable.  Review of systems except as noted above.  She, of course, has had no periods since her hysterectomy in February, 2005.   PHYSICAL EXAMINATION:  VITAL SIGNS:  Temperature 100.9, pulse 105,  respirations 17, blood pressure 114/70.  Weight 174.  GENERAL:  A 70 year old female who appears ill and nauseated.  HEENT:  Ears and tympanic membranes are unremarkable.  Eyes react normal to  light and accommodation.  Extraocular movements are intact.  Pharynx benign.  Tongue is a little dry.  NECK:  No enlargement of the thyroid.  No nodes palpated.  LUNGS:  Clear to auscultation and percussion.  HEART:  Normal sinus rhythm with no murmur detected.  BREASTS:  Not examined.  ABDOMEN:  Low midline right upper quadrant low transverse scars.  There is   tenderness in the suprapubic area and in the left CVA.  Liver, kidney,  spleen, and hernia are not detected.  There is a tender mass in the left  pelvis.  GENITOURINARY:  External genitalia and meatus are normal.  The bladder and  the left pelvic mass are very tender.  Support is fairly good.  RECTAL:  The anus and perineum are normal.  Rectal tone is fairly good.  EXTREMITIES:  There is edema of the left leg.  The pulses are fair.  NEUROLOGIC:  Grossly normal reflexes and sensation.  SKIN:  Unremarkable.  HEMATOPOIETIC/LYMPHATIC:  Unremarkable.   DIAGNOSIS:  1. Left-sided pelvic mass, either lymphocele or pelvic abscess.  2. Left hydronephrosis secondary to #1.  3. Cystitis with 15-50 white cells.  4. Rule out left pyelonephritis.  5. Carcinoma of the uterus, post hysterectomy in February, 2005.  6. Cholecystectomy in 1973.  7. Right ureteral lithotomy for ureteral calculus in 1964.   PLAN:  Admit.  Consultation with Dr. Fermin Schwab.  Drainage of left  pelvic mass.                                               Raelyn Mora., M.D.    HB/MEDQ  D:  08/28/2003  T:  08/28/2003  Job:  [id number]   cc:   Jori Moll L. Parks Neptune, M.D.  [place] [place]  [place] [address]  Fax: [phone]   Marti Sleigh, M.D.

## 2010-07-05 NOTE — Consult Note (Signed)
NAME:  Kathy Ho, Kathy Ho            ACCOUNT NO.:  [id number]   MEDICAL RECORD NO.:  [id number]          PATIENT TYPE:  OUT   LOCATION:  GYN                          FACILITY:  [facility]   PHYSICIAN:  Paola A. Alycia Rossetti, MD    DATE OF BIRTH:  1940/05/20   DATE OF CONSULTATION:  06/17/2005  DATE OF DISCHARGE:                                   CONSULTATION   The patient is a 20-year with history of stage IB grade 2 endometrial  carcinoma who underwent TAH, BSO and appropriate staging in February 2005.  Her pathology was consistent with a grade II lesion, no lymphovascular space  involvement and 25% myometrial invasion. Postoperatively, she developed a  left sided lymphocyst which was successfully drained with negative cytology.  This was in July 2005.  I last saw her in September 2006 at which time her  exam and Pap smear were unremarkable.  She was seen by Dr. Nicki Reaper in January  2006 per her report with a negative evaluation.  There was no note available  for [place] at that time. She comes in today for follow-up.  She is overall doing  quite well.  She is very distressed that she had been diagnosed with  diabetes.  Her sugars were quite high, though does not recall what her A1c  was.  She is now more medications. She states she cannot state that she  feels any differently.  She never really felt bad in the past.   REVIEW OF SYSTEMS:  She denies any chest pain, shortness of breath, nausea,  vomiting, fevers, chills, headaches or visual changes.  She denies any  change in bowel or bladder habits, any vaginal bleeding.  She has had some  weight loss since being diagnosed with her diabetes.  She denies any  abdominal or pelvic pain. Does not feel that she has a recurrence of her  lymphocyst.   MEDICATIONS:  1.  Avandia 4 mg daily.  2.  Niaspan 500 mg daily.  3.  TriCor 145 mg daily.  4.  Diovan 80 mg daily.  5.  Synthroid 0.075 mg daily.  6.  Aspirin 81 mg daily.   HEALTH MAINTENANCE:  Her  cholesterol was elevated at 212.  Her triglycerides  were 340.  She has a follow-up with her primary physician.   PHYSICAL EXAMINATION:  VITAL SIGNS:  Weight 174 pounds which is down 8  pounds since we last saw her. Well-nourished, well-developed female in no  acute distress.  NECK:  Is supple. There is no lymphadenopathy, no thyromegaly.  LUNGS:  Were clear to auscultation bilaterally.  CARDIOVASCULAR:  Exam regular rate and rhythm.  ABDOMEN:  She has a well-healed, right-sided paramedian incision.  There is  no evidence of an incisional hernia. Abdomen is soft, nontender,  nondistended.  There are no palpable masses or hepatomegaly.  Groins are  negative for adenopathy.  EXTREMITIES:  She has slight edema of her left ankle. It appears less that  it was at her last visit.  PELVIC:  External genitalia is within normal limits, though atrophic.  The  vagina is atrophic as  is the vaginal cuff.  The vaginal cuff is visualized.  There are no visible lesions.  ThinPrep Pap was submitted without  difficulty.  Bimanual examination reveals no masses or nodularity.  Rectal  confirms. There is no stool in the vault for guaiac.   ASSESSMENT:  A 70 year old stage IB grade 2 endometrial adenocarcinoma with  no evidence of recurrent disease.   PLAN:  We will follow up on results from her Pap smear from today.  She is  now two years and can have every six-month appointments.  She knows that she  can either see Dr. Nicki Reaper in six months' time and return to see [place] in one  year's time, or we would be happy to see her in six months' time. We have  given her the option of scheduling the appointment at her convenience.   She will notify us if she has any change in her bowel or bladder habits, any  vaginal bleeding or recurrence of her pain.  She seems to be feeling much  better than she was last year.  We will follow results of her Pap smear and  notify her of the results.      Paola A. Alycia Rossetti, MD   Electronically Signed     PAG/MEDQ  D:  06/17/2005  T:  06/18/2005  Job:  [id number]   cc:   Kimberlee Nearing  [phone]   Ann Held, M.D.   Caswell Corwin, R.N.  [address] [place], [place] [address]

## 2010-07-05 NOTE — Op Note (Signed)
NAME:  Kathy Ho, Kathy Ho                      ACCOUNT NO.:  [id number]   MEDICAL RECORD NO.:  [id number]                   PATIENT TYPE:  INP   LOCATION:  [id number]                                 FACILITY:  [facility]   PHYSICIAN:  Marti Sleigh, M.D.         DATE OF BIRTH:  [birth year]   DATE OF PROCEDURE:  DATE OF DISCHARGE:                                 OPERATIVE REPORT   PREOPERATIVE DIAGNOSIS:  Grade 2 endometrial carcinoma.   POSTOPERATIVE DIAGNOSES:  Grade 2 endometrial cancer with an umbilical  hernia.   PROCEDURES:  Total abdominal hysterectomy and bilateral salpingo-  oophorectomy, pelvic and periaortic lymphadenectomy, repair of umbilical  hernia.   SURGEON:  Marti Sleigh, M.D.   ASSISTANTS:  Agnes Lawrence, M.D., Caswell Corwin, R.N.   ANESTHESIA:  General with orotracheal tube.   ESTIMATED BLOOD LOSS:  400 cc.   SURGICAL FINDINGS:  At time of exploration, the upper abdomen was normal  except for adhesions of the omentum to the prior cholecystectomy incision.  There were no enlarged pelvic or periaortic lymph nodes.  The uterus was  normal size, and the tubes and ovaries appeared normal.  On frozen section,  there was intermediate depth of invasion with no evidence of extrauterine  metastasis.   PROCEDURE:  The patient was brought to the operating room and after  satisfactory attainment of general anesthesia was placed in the modified  lithotomy position in [place].  The anterior abdominal wall,  perineum, and vagina were prepped with Betadine. A Foley catheter was  inserted, and the patient was draped.  The abdomen was entered through a  midline incision extending above the umbilicus.  Peritoneal washings were  obtained from the pelvis, and the upper abdomen was explored.  Adhesions of  the omentum to the anterior abdominal wall were lysed using sharp and blunt  dissection and hemostasis achieved with cautery.  Buckwalter retractor  was  assembled, and the bowel was packed out of the pelvis.  The uterus was  grasped with long Kelly clamps.  The round ligaments were divided with  cautery, and the retroperitoneal space opened identifying the vessels and  ureter.  The ovarian vessels were skeletonized, clamped, cut, free tied, and  suture ligated.  The bladder flap was advanced with sharp and blunt  dissection.  The uterine vessels were skeletonized, then clamped, cut, and  suture ligated.  In a stepwise fashion, the paracervical and cardinal  ligaments were clamped, cut, and suture ligated. The vaginal angle was  crossclamped, and the vagina transected from its connection to the cervix.  The uterus, cervix, tubes, and ovaries were handed off the operative field  and submitted to frozen section with the above-noted findings.  The vaginal  angles were transfixed with 0 Vicryl, and the central portion of the vagina  reapproximated with interrupted figure-of-eight sutures of 0 Vicryl.   Attention was turned to performing the pelvic lymphadenopathy.  External  iliac vessels were exposed,  and the lymph nodes overlying the external iliac  artery and vein were excised.  The vein retractor elevated the external  iliac vein, and the obturator fossa was entered.  Lymph nodes in the  obturator fossa and along the hypogastric artery were excised with care  taken to avoid injury to vessels and the obturator nerve.  Throughout the  procedure, hemostasis was achieved with cautery and Hemoclips.  A hot pack  was placed in the pelvis, and a similar procedure was performed on the  opposite side.   Attention was then turned to performing the periaortic lymphadenectomy.  The  left paracolic gutter was incised, and the peritoneal incision extended  cephalad toward the kidney.  The left colon and ureter were reflected  medially, and the left common iliac artery and left lateral aspect of the  aorta were exposed.  Lymph nodes were excised  from the left common iliac  artery and along the aorta to above the level of the inferior mesenteric  artery.  Several lumbar veins and one lumbar artery were identified  throughout the dissection, and they were preserved.   The retractors were repositioned, and a peritoneal incision was then made  overlying the right common iliac artery. This incision extended along the  right side of the aorta.  We dissected beneath the retroperitoneal portion  of the duodenum and elevated that beneath a Deaver retractor.  The right  ureter was identified and mobilized laterally.  Thereafter, lymph nodes  overlying the right common iliac artery and vena cava and right side of the  aorta were excised up to approximately several cm above the inferior  mesenteric artery.  Throughout the dissection, hemostasis was again achieved  with cautery and Hemoclips.  A point of venous bleeding overlying the lower  vena cava required a figure-of-eight suture of 5-0 Prolene to achieve  hemostasis.  At the upper aspect of the dissection, there was some oozing  without a specific point of bleeding being able to be recognized.  I,  therefore, placed Avitene over this and packed it.  After holding the  Avitene in place for two minutes, the site was inspected and found to be  hemostatic.   The pelvis was re-evaluated, irrigated with saline, and hemostasis was  achieved.  The packs and retractors were removed, and the anterior abdominal  wall closed in layers, the first being a running mass closure using #1 PDS.  Prior to complete closure, a figure-of-eight suture of 0 Prolene was placed  intraperitoneally incorporating the fascia in order to obliterate the  umbilical hernia.  Hemostasis in the subcutaneous tissue was achieved with  cautery.  The wound was irrigated with saline and then closed with skin  staples.  A dressing was applied.  The patient was awakened from anesthesia and taken to the recovery room in  satisfactory condition.  Sponge, needle,  and instruments were correct x2.                                               Marti Sleigh, M.D.    DC/MEDQ  D:  04/04/2003  T:  04/04/2003  Job:  [id number]   cc:   Agnes Lawrence, M.D.  [address]  [place]  [place] [address]  Fax: [phone]   Caswell Corwin, R.N.  [address], [place] [address]  Kimberlee Nearing  [address] [place]  [place] [address]  Fax: [phone]

## 2011-02-20 DIAGNOSIS — E039 Hypothyroidism, unspecified: Secondary | ICD-10-CM | POA: Diagnosis not present

## 2011-02-20 DIAGNOSIS — Z79899 Other long term (current) drug therapy: Secondary | ICD-10-CM | POA: Diagnosis not present

## 2011-02-20 DIAGNOSIS — E119 Type 2 diabetes mellitus without complications: Secondary | ICD-10-CM | POA: Diagnosis not present

## 2011-02-20 DIAGNOSIS — M949 Disorder of cartilage, unspecified: Secondary | ICD-10-CM | POA: Diagnosis not present

## 2011-02-20 DIAGNOSIS — M899 Disorder of bone, unspecified: Secondary | ICD-10-CM | POA: Diagnosis not present

## 2011-02-25 DIAGNOSIS — Z1231 Encounter for screening mammogram for malignant neoplasm of breast: Secondary | ICD-10-CM | POA: Diagnosis not present

## 2011-02-27 DIAGNOSIS — M948X9 Other specified disorders of cartilage, unspecified sites: Secondary | ICD-10-CM | POA: Diagnosis not present

## 2011-02-27 DIAGNOSIS — Z8544 Personal history of malignant neoplasm of other female genital organs: Secondary | ICD-10-CM | POA: Diagnosis not present

## 2011-03-03 DIAGNOSIS — Z1211 Encounter for screening for malignant neoplasm of colon: Secondary | ICD-10-CM | POA: Diagnosis not present

## 2011-03-10 DIAGNOSIS — E559 Vitamin D deficiency, unspecified: Secondary | ICD-10-CM | POA: Diagnosis not present

## 2011-04-24 DIAGNOSIS — E782 Mixed hyperlipidemia: Secondary | ICD-10-CM | POA: Diagnosis not present

## 2011-04-24 DIAGNOSIS — Z79899 Other long term (current) drug therapy: Secondary | ICD-10-CM | POA: Diagnosis not present

## 2011-04-24 DIAGNOSIS — E119 Type 2 diabetes mellitus without complications: Secondary | ICD-10-CM | POA: Diagnosis not present

## 2011-05-26 DIAGNOSIS — R498 Other voice and resonance disorders: Secondary | ICD-10-CM | POA: Diagnosis not present

## 2011-05-26 DIAGNOSIS — IMO0001 Reserved for inherently not codable concepts without codable children: Secondary | ICD-10-CM | POA: Diagnosis not present

## 2011-05-26 DIAGNOSIS — J3800 Paralysis of vocal cords and larynx, unspecified: Secondary | ICD-10-CM | POA: Diagnosis not present

## 2011-06-23 DIAGNOSIS — J3800 Paralysis of vocal cords and larynx, unspecified: Secondary | ICD-10-CM | POA: Diagnosis not present

## 2011-06-23 DIAGNOSIS — IMO0001 Reserved for inherently not codable concepts without codable children: Secondary | ICD-10-CM | POA: Diagnosis not present

## 2011-06-23 DIAGNOSIS — R498 Other voice and resonance disorders: Secondary | ICD-10-CM | POA: Diagnosis not present

## 2011-07-07 DIAGNOSIS — R498 Other voice and resonance disorders: Secondary | ICD-10-CM | POA: Diagnosis not present

## 2011-07-07 DIAGNOSIS — J3800 Paralysis of vocal cords and larynx, unspecified: Secondary | ICD-10-CM | POA: Diagnosis not present

## 2011-07-07 DIAGNOSIS — IMO0001 Reserved for inherently not codable concepts without codable children: Secondary | ICD-10-CM | POA: Diagnosis not present

## 2011-07-11 DIAGNOSIS — IMO0002 Reserved for concepts with insufficient information to code with codable children: Secondary | ICD-10-CM | POA: Diagnosis not present

## 2011-07-11 DIAGNOSIS — J3801 Paralysis of vocal cords and larynx, unilateral: Secondary | ICD-10-CM | POA: Diagnosis not present

## 2011-07-11 DIAGNOSIS — R498 Other voice and resonance disorders: Secondary | ICD-10-CM | POA: Diagnosis not present

## 2011-07-24 DIAGNOSIS — Z79899 Other long term (current) drug therapy: Secondary | ICD-10-CM | POA: Diagnosis not present

## 2011-07-24 DIAGNOSIS — E782 Mixed hyperlipidemia: Secondary | ICD-10-CM | POA: Diagnosis not present

## 2011-07-24 DIAGNOSIS — E559 Vitamin D deficiency, unspecified: Secondary | ICD-10-CM | POA: Diagnosis not present

## 2011-07-24 DIAGNOSIS — E119 Type 2 diabetes mellitus without complications: Secondary | ICD-10-CM | POA: Diagnosis not present

## 2011-08-11 DIAGNOSIS — E119 Type 2 diabetes mellitus without complications: Secondary | ICD-10-CM | POA: Diagnosis not present

## 2011-08-11 DIAGNOSIS — E782 Mixed hyperlipidemia: Secondary | ICD-10-CM | POA: Diagnosis not present

## 2011-10-04 DIAGNOSIS — I10 Essential (primary) hypertension: Secondary | ICD-10-CM | POA: Diagnosis not present

## 2011-10-04 DIAGNOSIS — R259 Unspecified abnormal involuntary movements: Secondary | ICD-10-CM | POA: Diagnosis not present

## 2011-10-05 DIAGNOSIS — I10 Essential (primary) hypertension: Secondary | ICD-10-CM | POA: Diagnosis not present

## 2011-10-05 DIAGNOSIS — R259 Unspecified abnormal involuntary movements: Secondary | ICD-10-CM | POA: Diagnosis not present

## 2011-10-08 DIAGNOSIS — R079 Chest pain, unspecified: Secondary | ICD-10-CM | POA: Diagnosis not present

## 2011-10-13 DIAGNOSIS — Z79899 Other long term (current) drug therapy: Secondary | ICD-10-CM | POA: Diagnosis not present

## 2011-10-13 DIAGNOSIS — E782 Mixed hyperlipidemia: Secondary | ICD-10-CM | POA: Diagnosis not present

## 2011-10-13 DIAGNOSIS — E119 Type 2 diabetes mellitus without complications: Secondary | ICD-10-CM | POA: Diagnosis not present

## 2011-10-23 DIAGNOSIS — E782 Mixed hyperlipidemia: Secondary | ICD-10-CM | POA: Diagnosis not present

## 2011-11-13 DIAGNOSIS — E119 Type 2 diabetes mellitus without complications: Secondary | ICD-10-CM | POA: Diagnosis not present

## 2011-11-13 DIAGNOSIS — H2589 Other age-related cataract: Secondary | ICD-10-CM | POA: Diagnosis not present

## 2011-11-13 DIAGNOSIS — H1045 Other chronic allergic conjunctivitis: Secondary | ICD-10-CM | POA: Diagnosis not present

## 2012-01-06 DIAGNOSIS — R498 Other voice and resonance disorders: Secondary | ICD-10-CM | POA: Diagnosis not present

## 2012-01-06 DIAGNOSIS — J3801 Paralysis of vocal cords and larynx, unilateral: Secondary | ICD-10-CM | POA: Diagnosis not present

## 2012-01-19 DIAGNOSIS — E782 Mixed hyperlipidemia: Secondary | ICD-10-CM | POA: Diagnosis not present

## 2012-01-19 DIAGNOSIS — Z79899 Other long term (current) drug therapy: Secondary | ICD-10-CM | POA: Diagnosis not present

## 2012-01-26 DIAGNOSIS — Z23 Encounter for immunization: Secondary | ICD-10-CM | POA: Diagnosis not present

## 2012-01-26 DIAGNOSIS — E782 Mixed hyperlipidemia: Secondary | ICD-10-CM | POA: Diagnosis not present

## 2012-06-24 DIAGNOSIS — E782 Mixed hyperlipidemia: Secondary | ICD-10-CM | POA: Diagnosis not present

## 2012-06-24 DIAGNOSIS — E039 Hypothyroidism, unspecified: Secondary | ICD-10-CM | POA: Diagnosis not present

## 2012-07-01 DIAGNOSIS — E119 Type 2 diabetes mellitus without complications: Secondary | ICD-10-CM | POA: Diagnosis not present

## 2012-08-22 DIAGNOSIS — K5732 Diverticulitis of large intestine without perforation or abscess without bleeding: Secondary | ICD-10-CM | POA: Diagnosis not present

## 2012-08-22 DIAGNOSIS — E119 Type 2 diabetes mellitus without complications: Secondary | ICD-10-CM | POA: Diagnosis not present

## 2012-10-28 DIAGNOSIS — R112 Nausea with vomiting, unspecified: Secondary | ICD-10-CM | POA: Diagnosis not present

## 2012-10-28 DIAGNOSIS — M79609 Pain in unspecified limb: Secondary | ICD-10-CM | POA: Diagnosis not present

## 2012-12-09 DIAGNOSIS — E119 Type 2 diabetes mellitus without complications: Secondary | ICD-10-CM | POA: Diagnosis not present

## 2012-12-09 DIAGNOSIS — E039 Hypothyroidism, unspecified: Secondary | ICD-10-CM | POA: Diagnosis not present

## 2012-12-09 DIAGNOSIS — Z79899 Other long term (current) drug therapy: Secondary | ICD-10-CM | POA: Diagnosis not present

## 2012-12-13 DIAGNOSIS — E119 Type 2 diabetes mellitus without complications: Secondary | ICD-10-CM | POA: Diagnosis not present

## 2012-12-16 DIAGNOSIS — E119 Type 2 diabetes mellitus without complications: Secondary | ICD-10-CM | POA: Diagnosis not present

## 2012-12-16 DIAGNOSIS — Z0000 Encounter for general adult medical examination without abnormal findings: Secondary | ICD-10-CM | POA: Diagnosis not present

## 2012-12-16 DIAGNOSIS — Z23 Encounter for immunization: Secondary | ICD-10-CM | POA: Diagnosis not present

## 2013-02-03 DIAGNOSIS — R0902 Hypoxemia: Secondary | ICD-10-CM | POA: Diagnosis not present

## 2013-02-03 DIAGNOSIS — E119 Type 2 diabetes mellitus without complications: Secondary | ICD-10-CM | POA: Diagnosis not present

## 2013-02-03 DIAGNOSIS — J9600 Acute respiratory failure, unspecified whether with hypoxia or hypercapnia: Secondary | ICD-10-CM | POA: Diagnosis not present

## 2013-02-03 DIAGNOSIS — J189 Pneumonia, unspecified organism: Secondary | ICD-10-CM | POA: Diagnosis not present

## 2013-02-03 DIAGNOSIS — E039 Hypothyroidism, unspecified: Secondary | ICD-10-CM | POA: Diagnosis not present

## 2013-02-03 DIAGNOSIS — Z7982 Long term (current) use of aspirin: Secondary | ICD-10-CM | POA: Diagnosis not present

## 2013-02-03 DIAGNOSIS — E871 Hypo-osmolality and hyponatremia: Secondary | ICD-10-CM | POA: Diagnosis not present

## 2013-02-03 DIAGNOSIS — R6510 Systemic inflammatory response syndrome (SIRS) of non-infectious origin without acute organ dysfunction: Secondary | ICD-10-CM | POA: Diagnosis not present

## 2013-02-03 DIAGNOSIS — G4733 Obstructive sleep apnea (adult) (pediatric): Secondary | ICD-10-CM | POA: Diagnosis present

## 2013-02-03 DIAGNOSIS — R52 Pain, unspecified: Secondary | ICD-10-CM | POA: Diagnosis present

## 2013-02-03 DIAGNOSIS — E1159 Type 2 diabetes mellitus with other circulatory complications: Secondary | ICD-10-CM | POA: Diagnosis not present

## 2013-02-03 DIAGNOSIS — Z79899 Other long term (current) drug therapy: Secondary | ICD-10-CM | POA: Diagnosis not present

## 2013-02-03 DIAGNOSIS — A419 Sepsis, unspecified organism: Secondary | ICD-10-CM | POA: Diagnosis not present

## 2013-02-03 DIAGNOSIS — I10 Essential (primary) hypertension: Secondary | ICD-10-CM | POA: Diagnosis present

## 2013-02-03 DIAGNOSIS — E785 Hyperlipidemia, unspecified: Secondary | ICD-10-CM | POA: Diagnosis not present

## 2013-02-09 DIAGNOSIS — Z09 Encounter for follow-up examination after completed treatment for conditions other than malignant neoplasm: Secondary | ICD-10-CM | POA: Diagnosis not present

## 2013-02-11 DIAGNOSIS — Z888 Allergy status to other drugs, medicaments and biological substances status: Secondary | ICD-10-CM | POA: Diagnosis not present

## 2013-03-02 DIAGNOSIS — E119 Type 2 diabetes mellitus without complications: Secondary | ICD-10-CM | POA: Diagnosis not present

## 2013-03-02 DIAGNOSIS — E782 Mixed hyperlipidemia: Secondary | ICD-10-CM | POA: Diagnosis not present

## 2013-03-02 DIAGNOSIS — Z79899 Other long term (current) drug therapy: Secondary | ICD-10-CM | POA: Diagnosis not present

## 2013-03-02 DIAGNOSIS — E039 Hypothyroidism, unspecified: Secondary | ICD-10-CM | POA: Diagnosis not present

## 2013-03-09 DIAGNOSIS — E119 Type 2 diabetes mellitus without complications: Secondary | ICD-10-CM | POA: Diagnosis not present

## 2013-03-18 DIAGNOSIS — H2589 Other age-related cataract: Secondary | ICD-10-CM | POA: Diagnosis not present

## 2013-03-18 DIAGNOSIS — H18599 Other hereditary corneal dystrophies, unspecified eye: Secondary | ICD-10-CM | POA: Diagnosis not present

## 2013-04-12 DIAGNOSIS — H269 Unspecified cataract: Secondary | ICD-10-CM | POA: Diagnosis not present

## 2013-04-12 DIAGNOSIS — H268 Other specified cataract: Secondary | ICD-10-CM | POA: Diagnosis not present

## 2013-04-12 DIAGNOSIS — H2589 Other age-related cataract: Secondary | ICD-10-CM | POA: Diagnosis not present

## 2013-05-10 DIAGNOSIS — E119 Type 2 diabetes mellitus without complications: Secondary | ICD-10-CM | POA: Diagnosis not present

## 2013-05-10 DIAGNOSIS — H269 Unspecified cataract: Secondary | ICD-10-CM | POA: Diagnosis not present

## 2013-05-10 DIAGNOSIS — Z79899 Other long term (current) drug therapy: Secondary | ICD-10-CM | POA: Diagnosis not present

## 2013-05-10 DIAGNOSIS — E785 Hyperlipidemia, unspecified: Secondary | ICD-10-CM | POA: Diagnosis not present

## 2013-05-10 DIAGNOSIS — I10 Essential (primary) hypertension: Secondary | ICD-10-CM | POA: Diagnosis not present

## 2013-05-10 DIAGNOSIS — H2589 Other age-related cataract: Secondary | ICD-10-CM | POA: Diagnosis not present

## 2013-05-10 DIAGNOSIS — H268 Other specified cataract: Secondary | ICD-10-CM | POA: Diagnosis not present

## 2013-08-31 DIAGNOSIS — E119 Type 2 diabetes mellitus without complications: Secondary | ICD-10-CM | POA: Diagnosis not present

## 2013-09-02 DIAGNOSIS — N949 Unspecified condition associated with female genital organs and menstrual cycle: Secondary | ICD-10-CM | POA: Diagnosis not present

## 2013-09-02 DIAGNOSIS — G5700 Lesion of sciatic nerve, unspecified lower limb: Secondary | ICD-10-CM | POA: Diagnosis not present

## 2013-09-02 DIAGNOSIS — M549 Dorsalgia, unspecified: Secondary | ICD-10-CM | POA: Diagnosis not present

## 2013-09-08 DIAGNOSIS — E119 Type 2 diabetes mellitus without complications: Secondary | ICD-10-CM | POA: Diagnosis not present

## 2013-10-12 DIAGNOSIS — M810 Age-related osteoporosis without current pathological fracture: Secondary | ICD-10-CM | POA: Diagnosis not present

## 2013-10-12 DIAGNOSIS — J069 Acute upper respiratory infection, unspecified: Secondary | ICD-10-CM | POA: Diagnosis not present

## 2013-10-25 DIAGNOSIS — M810 Age-related osteoporosis without current pathological fracture: Secondary | ICD-10-CM | POA: Diagnosis not present

## 2013-12-06 DIAGNOSIS — E119 Type 2 diabetes mellitus without complications: Secondary | ICD-10-CM | POA: Diagnosis not present

## 2014-02-20 DIAGNOSIS — E119 Type 2 diabetes mellitus without complications: Secondary | ICD-10-CM | POA: Diagnosis not present

## 2014-02-20 DIAGNOSIS — E039 Hypothyroidism, unspecified: Secondary | ICD-10-CM | POA: Diagnosis not present

## 2014-02-23 DIAGNOSIS — Z1231 Encounter for screening mammogram for malignant neoplasm of breast: Secondary | ICD-10-CM | POA: Diagnosis not present

## 2014-02-27 DIAGNOSIS — E119 Type 2 diabetes mellitus without complications: Secondary | ICD-10-CM | POA: Diagnosis not present

## 2014-02-27 DIAGNOSIS — G4733 Obstructive sleep apnea (adult) (pediatric): Secondary | ICD-10-CM | POA: Diagnosis not present

## 2014-02-27 DIAGNOSIS — Z09 Encounter for follow-up examination after completed treatment for conditions other than malignant neoplasm: Secondary | ICD-10-CM | POA: Diagnosis not present

## 2014-04-25 DIAGNOSIS — M810 Age-related osteoporosis without current pathological fracture: Secondary | ICD-10-CM | POA: Diagnosis not present

## 2014-06-12 DIAGNOSIS — N200 Calculus of kidney: Secondary | ICD-10-CM | POA: Diagnosis not present

## 2014-06-26 DIAGNOSIS — N183 Chronic kidney disease, stage 3 (moderate): Secondary | ICD-10-CM | POA: Diagnosis not present

## 2014-06-26 DIAGNOSIS — N200 Calculus of kidney: Secondary | ICD-10-CM | POA: Diagnosis not present

## 2014-08-29 DIAGNOSIS — E039 Hypothyroidism, unspecified: Secondary | ICD-10-CM | POA: Diagnosis not present

## 2014-08-29 DIAGNOSIS — E119 Type 2 diabetes mellitus without complications: Secondary | ICD-10-CM | POA: Diagnosis not present

## 2014-09-01 DIAGNOSIS — E1122 Type 2 diabetes mellitus with diabetic chronic kidney disease: Secondary | ICD-10-CM | POA: Diagnosis not present

## 2014-09-01 DIAGNOSIS — N183 Chronic kidney disease, stage 3 (moderate): Secondary | ICD-10-CM | POA: Diagnosis not present

## 2014-10-26 DIAGNOSIS — Z23 Encounter for immunization: Secondary | ICD-10-CM | POA: Diagnosis not present

## 2014-11-10 DIAGNOSIS — Z23 Encounter for immunization: Secondary | ICD-10-CM | POA: Diagnosis not present

## 2014-12-18 DIAGNOSIS — E119 Type 2 diabetes mellitus without complications: Secondary | ICD-10-CM | POA: Diagnosis not present

## 2015-02-08 DIAGNOSIS — E039 Hypothyroidism, unspecified: Secondary | ICD-10-CM | POA: Diagnosis not present

## 2015-02-08 DIAGNOSIS — E119 Type 2 diabetes mellitus without complications: Secondary | ICD-10-CM | POA: Diagnosis not present

## 2015-02-15 DIAGNOSIS — Z23 Encounter for immunization: Secondary | ICD-10-CM | POA: Diagnosis not present

## 2015-02-15 DIAGNOSIS — N184 Chronic kidney disease, stage 4 (severe): Secondary | ICD-10-CM | POA: Diagnosis not present

## 2015-02-15 DIAGNOSIS — E1122 Type 2 diabetes mellitus with diabetic chronic kidney disease: Secondary | ICD-10-CM | POA: Diagnosis not present

## 2015-02-27 DIAGNOSIS — Z1231 Encounter for screening mammogram for malignant neoplasm of breast: Secondary | ICD-10-CM | POA: Diagnosis not present

## 2015-04-27 DIAGNOSIS — M810 Age-related osteoporosis without current pathological fracture: Secondary | ICD-10-CM | POA: Diagnosis not present

## 2015-07-23 DIAGNOSIS — E039 Hypothyroidism, unspecified: Secondary | ICD-10-CM | POA: Diagnosis not present

## 2015-07-23 DIAGNOSIS — E119 Type 2 diabetes mellitus without complications: Secondary | ICD-10-CM | POA: Diagnosis not present

## 2015-07-30 DIAGNOSIS — E1122 Type 2 diabetes mellitus with diabetic chronic kidney disease: Secondary | ICD-10-CM | POA: Diagnosis not present

## 2015-07-30 DIAGNOSIS — E1129 Type 2 diabetes mellitus with other diabetic kidney complication: Secondary | ICD-10-CM | POA: Diagnosis not present

## 2015-07-30 DIAGNOSIS — N184 Chronic kidney disease, stage 4 (severe): Secondary | ICD-10-CM | POA: Diagnosis not present

## 2015-07-30 DIAGNOSIS — Z0000 Encounter for general adult medical examination without abnormal findings: Secondary | ICD-10-CM | POA: Diagnosis not present

## 2015-07-30 DIAGNOSIS — E1165 Type 2 diabetes mellitus with hyperglycemia: Secondary | ICD-10-CM | POA: Diagnosis not present

## 2015-10-16 ENCOUNTER — Other Ambulatory Visit: Payer: Self-pay

## 2015-10-29 DIAGNOSIS — M810 Age-related osteoporosis without current pathological fracture: Secondary | ICD-10-CM | POA: Diagnosis not present

## 2015-11-14 DIAGNOSIS — R300 Dysuria: Secondary | ICD-10-CM | POA: Diagnosis not present

## 2015-12-26 DIAGNOSIS — Z23 Encounter for immunization: Secondary | ICD-10-CM | POA: Diagnosis not present

## 2016-01-15 DIAGNOSIS — Z961 Presence of intraocular lens: Secondary | ICD-10-CM | POA: Diagnosis not present

## 2016-01-15 DIAGNOSIS — E119 Type 2 diabetes mellitus without complications: Secondary | ICD-10-CM | POA: Diagnosis not present

## 2016-01-15 DIAGNOSIS — H43812 Vitreous degeneration, left eye: Secondary | ICD-10-CM | POA: Diagnosis not present

## 2016-01-22 DIAGNOSIS — E039 Hypothyroidism, unspecified: Secondary | ICD-10-CM | POA: Diagnosis not present

## 2016-01-22 DIAGNOSIS — E1129 Type 2 diabetes mellitus with other diabetic kidney complication: Secondary | ICD-10-CM | POA: Diagnosis not present

## 2016-01-22 DIAGNOSIS — Z79899 Other long term (current) drug therapy: Secondary | ICD-10-CM | POA: Diagnosis not present

## 2016-01-30 DIAGNOSIS — N183 Chronic kidney disease, stage 3 (moderate): Secondary | ICD-10-CM | POA: Diagnosis not present

## 2016-01-30 DIAGNOSIS — Z9181 History of falling: Secondary | ICD-10-CM | POA: Diagnosis not present

## 2016-01-30 DIAGNOSIS — E1165 Type 2 diabetes mellitus with hyperglycemia: Secondary | ICD-10-CM | POA: Diagnosis not present

## 2016-01-30 DIAGNOSIS — E1129 Type 2 diabetes mellitus with other diabetic kidney complication: Secondary | ICD-10-CM | POA: Diagnosis not present

## 2016-04-16 DIAGNOSIS — M25551 Pain in right hip: Secondary | ICD-10-CM | POA: Diagnosis not present

## 2016-04-16 DIAGNOSIS — Z9181 History of falling: Secondary | ICD-10-CM | POA: Diagnosis not present

## 2016-04-16 DIAGNOSIS — Z23 Encounter for immunization: Secondary | ICD-10-CM | POA: Diagnosis not present

## 2016-04-16 DIAGNOSIS — Z1389 Encounter for screening for other disorder: Secondary | ICD-10-CM | POA: Diagnosis not present

## 2016-04-28 DIAGNOSIS — M810 Age-related osteoporosis without current pathological fracture: Secondary | ICD-10-CM | POA: Diagnosis not present

## 2016-05-07 DIAGNOSIS — Z1389 Encounter for screening for other disorder: Secondary | ICD-10-CM | POA: Diagnosis not present

## 2016-05-07 DIAGNOSIS — Z6826 Body mass index (BMI) 26.0-26.9, adult: Secondary | ICD-10-CM | POA: Diagnosis not present

## 2016-05-07 DIAGNOSIS — M25551 Pain in right hip: Secondary | ICD-10-CM | POA: Diagnosis not present

## 2016-05-07 DIAGNOSIS — Z1231 Encounter for screening mammogram for malignant neoplasm of breast: Secondary | ICD-10-CM | POA: Diagnosis not present

## 2016-05-07 DIAGNOSIS — E11649 Type 2 diabetes mellitus with hypoglycemia without coma: Secondary | ICD-10-CM | POA: Diagnosis not present

## 2016-05-07 DIAGNOSIS — M810 Age-related osteoporosis without current pathological fracture: Secondary | ICD-10-CM | POA: Diagnosis not present

## 2016-05-19 DIAGNOSIS — R928 Other abnormal and inconclusive findings on diagnostic imaging of breast: Secondary | ICD-10-CM | POA: Diagnosis not present

## 2016-05-19 DIAGNOSIS — N6312 Unspecified lump in the right breast, upper inner quadrant: Secondary | ICD-10-CM | POA: Diagnosis not present

## 2016-05-20 DIAGNOSIS — E11649 Type 2 diabetes mellitus with hypoglycemia without coma: Secondary | ICD-10-CM | POA: Diagnosis not present

## 2016-05-22 DIAGNOSIS — M79604 Pain in right leg: Secondary | ICD-10-CM | POA: Diagnosis not present

## 2016-05-22 DIAGNOSIS — C50211 Malignant neoplasm of upper-inner quadrant of right female breast: Secondary | ICD-10-CM | POA: Diagnosis not present

## 2016-05-22 DIAGNOSIS — M6281 Muscle weakness (generalized): Secondary | ICD-10-CM | POA: Diagnosis not present

## 2016-05-22 DIAGNOSIS — R2689 Other abnormalities of gait and mobility: Secondary | ICD-10-CM | POA: Diagnosis not present

## 2016-05-22 DIAGNOSIS — M545 Low back pain: Secondary | ICD-10-CM | POA: Diagnosis not present

## 2016-05-22 DIAGNOSIS — C50911 Malignant neoplasm of unspecified site of right female breast: Secondary | ICD-10-CM | POA: Diagnosis not present

## 2016-05-22 DIAGNOSIS — M25651 Stiffness of right hip, not elsewhere classified: Secondary | ICD-10-CM | POA: Diagnosis not present

## 2016-05-22 DIAGNOSIS — M25652 Stiffness of left hip, not elsewhere classified: Secondary | ICD-10-CM | POA: Diagnosis not present

## 2016-05-22 DIAGNOSIS — R293 Abnormal posture: Secondary | ICD-10-CM | POA: Diagnosis not present

## 2016-05-22 DIAGNOSIS — M25551 Pain in right hip: Secondary | ICD-10-CM | POA: Diagnosis not present

## 2016-05-22 DIAGNOSIS — N6312 Unspecified lump in the right breast, upper inner quadrant: Secondary | ICD-10-CM | POA: Diagnosis not present

## 2016-05-22 DIAGNOSIS — M2560 Stiffness of unspecified joint, not elsewhere classified: Secondary | ICD-10-CM | POA: Diagnosis not present

## 2016-05-26 DIAGNOSIS — I10 Essential (primary) hypertension: Secondary | ICD-10-CM | POA: Diagnosis not present

## 2016-05-26 DIAGNOSIS — E1121 Type 2 diabetes mellitus with diabetic nephropathy: Secondary | ICD-10-CM | POA: Diagnosis not present

## 2016-05-26 DIAGNOSIS — E039 Hypothyroidism, unspecified: Secondary | ICD-10-CM | POA: Diagnosis not present

## 2016-05-26 DIAGNOSIS — Z79899 Other long term (current) drug therapy: Secondary | ICD-10-CM | POA: Diagnosis not present

## 2016-05-26 DIAGNOSIS — C50919 Malignant neoplasm of unspecified site of unspecified female breast: Secondary | ICD-10-CM | POA: Diagnosis not present

## 2016-05-27 DIAGNOSIS — M6281 Muscle weakness (generalized): Secondary | ICD-10-CM | POA: Diagnosis not present

## 2016-05-27 DIAGNOSIS — M545 Low back pain: Secondary | ICD-10-CM | POA: Diagnosis not present

## 2016-05-27 DIAGNOSIS — M79604 Pain in right leg: Secondary | ICD-10-CM | POA: Diagnosis not present

## 2016-05-27 DIAGNOSIS — M25551 Pain in right hip: Secondary | ICD-10-CM | POA: Diagnosis not present

## 2016-05-27 DIAGNOSIS — R2689 Other abnormalities of gait and mobility: Secondary | ICD-10-CM | POA: Diagnosis not present

## 2016-05-27 DIAGNOSIS — C50911 Malignant neoplasm of unspecified site of right female breast: Secondary | ICD-10-CM | POA: Diagnosis not present

## 2016-05-30 DIAGNOSIS — M545 Low back pain: Secondary | ICD-10-CM | POA: Diagnosis not present

## 2016-05-30 DIAGNOSIS — C50911 Malignant neoplasm of unspecified site of right female breast: Secondary | ICD-10-CM | POA: Diagnosis not present

## 2016-05-30 DIAGNOSIS — M25551 Pain in right hip: Secondary | ICD-10-CM | POA: Diagnosis not present

## 2016-05-30 DIAGNOSIS — R2689 Other abnormalities of gait and mobility: Secondary | ICD-10-CM | POA: Diagnosis not present

## 2016-05-30 DIAGNOSIS — M79604 Pain in right leg: Secondary | ICD-10-CM | POA: Diagnosis not present

## 2016-05-30 DIAGNOSIS — M6281 Muscle weakness (generalized): Secondary | ICD-10-CM | POA: Diagnosis not present

## 2016-06-03 DIAGNOSIS — C50211 Malignant neoplasm of upper-inner quadrant of right female breast: Secondary | ICD-10-CM | POA: Diagnosis not present

## 2016-06-03 DIAGNOSIS — Z170 Estrogen receptor positive status [ER+]: Secondary | ICD-10-CM | POA: Diagnosis not present

## 2016-06-04 DIAGNOSIS — M545 Low back pain: Secondary | ICD-10-CM | POA: Diagnosis not present

## 2016-06-04 DIAGNOSIS — C50911 Malignant neoplasm of unspecified site of right female breast: Secondary | ICD-10-CM | POA: Diagnosis not present

## 2016-06-04 DIAGNOSIS — M79604 Pain in right leg: Secondary | ICD-10-CM | POA: Diagnosis not present

## 2016-06-04 DIAGNOSIS — R2689 Other abnormalities of gait and mobility: Secondary | ICD-10-CM | POA: Diagnosis not present

## 2016-06-04 DIAGNOSIS — M6281 Muscle weakness (generalized): Secondary | ICD-10-CM | POA: Diagnosis not present

## 2016-06-04 DIAGNOSIS — M25551 Pain in right hip: Secondary | ICD-10-CM | POA: Diagnosis not present

## 2016-06-06 DIAGNOSIS — M25551 Pain in right hip: Secondary | ICD-10-CM | POA: Diagnosis not present

## 2016-06-06 DIAGNOSIS — N3000 Acute cystitis without hematuria: Secondary | ICD-10-CM | POA: Diagnosis not present

## 2016-06-06 DIAGNOSIS — M79604 Pain in right leg: Secondary | ICD-10-CM | POA: Diagnosis not present

## 2016-06-06 DIAGNOSIS — M545 Low back pain: Secondary | ICD-10-CM | POA: Diagnosis not present

## 2016-06-06 DIAGNOSIS — R2689 Other abnormalities of gait and mobility: Secondary | ICD-10-CM | POA: Diagnosis not present

## 2016-06-06 DIAGNOSIS — M6281 Muscle weakness (generalized): Secondary | ICD-10-CM | POA: Diagnosis not present

## 2016-06-06 DIAGNOSIS — C50911 Malignant neoplasm of unspecified site of right female breast: Secondary | ICD-10-CM | POA: Diagnosis not present

## 2016-06-10 DIAGNOSIS — C50911 Malignant neoplasm of unspecified site of right female breast: Secondary | ICD-10-CM | POA: Diagnosis not present

## 2016-06-10 DIAGNOSIS — M25551 Pain in right hip: Secondary | ICD-10-CM | POA: Diagnosis not present

## 2016-06-10 DIAGNOSIS — M79604 Pain in right leg: Secondary | ICD-10-CM | POA: Diagnosis not present

## 2016-06-10 DIAGNOSIS — M545 Low back pain: Secondary | ICD-10-CM | POA: Diagnosis not present

## 2016-06-10 DIAGNOSIS — R2689 Other abnormalities of gait and mobility: Secondary | ICD-10-CM | POA: Diagnosis not present

## 2016-06-10 DIAGNOSIS — M6281 Muscle weakness (generalized): Secondary | ICD-10-CM | POA: Diagnosis not present

## 2016-06-18 DIAGNOSIS — C50211 Malignant neoplasm of upper-inner quadrant of right female breast: Secondary | ICD-10-CM | POA: Diagnosis not present

## 2016-06-18 DIAGNOSIS — H9190 Unspecified hearing loss, unspecified ear: Secondary | ICD-10-CM | POA: Diagnosis not present

## 2016-06-18 DIAGNOSIS — Z9989 Dependence on other enabling machines and devices: Secondary | ICD-10-CM | POA: Diagnosis not present

## 2016-06-18 DIAGNOSIS — Z170 Estrogen receptor positive status [ER+]: Secondary | ICD-10-CM | POA: Diagnosis not present

## 2016-06-18 DIAGNOSIS — I10 Essential (primary) hypertension: Secondary | ICD-10-CM | POA: Diagnosis not present

## 2016-06-18 DIAGNOSIS — E039 Hypothyroidism, unspecified: Secondary | ICD-10-CM | POA: Diagnosis not present

## 2016-06-18 DIAGNOSIS — E119 Type 2 diabetes mellitus without complications: Secondary | ICD-10-CM | POA: Diagnosis not present

## 2016-06-18 DIAGNOSIS — C50911 Malignant neoplasm of unspecified site of right female breast: Secondary | ICD-10-CM | POA: Diagnosis not present

## 2016-06-18 DIAGNOSIS — F519 Sleep disorder not due to a substance or known physiological condition, unspecified: Secondary | ICD-10-CM | POA: Diagnosis not present

## 2016-06-18 DIAGNOSIS — G4730 Sleep apnea, unspecified: Secondary | ICD-10-CM | POA: Diagnosis not present

## 2016-07-08 DIAGNOSIS — Z170 Estrogen receptor positive status [ER+]: Secondary | ICD-10-CM | POA: Diagnosis not present

## 2016-07-08 DIAGNOSIS — C50211 Malignant neoplasm of upper-inner quadrant of right female breast: Secondary | ICD-10-CM | POA: Diagnosis not present

## 2016-07-17 DIAGNOSIS — E1121 Type 2 diabetes mellitus with diabetic nephropathy: Secondary | ICD-10-CM | POA: Diagnosis not present

## 2016-07-24 DIAGNOSIS — C50911 Malignant neoplasm of unspecified site of right female breast: Secondary | ICD-10-CM | POA: Diagnosis not present

## 2016-07-24 DIAGNOSIS — C50211 Malignant neoplasm of upper-inner quadrant of right female breast: Secondary | ICD-10-CM | POA: Diagnosis not present

## 2016-07-24 DIAGNOSIS — Z170 Estrogen receptor positive status [ER+]: Secondary | ICD-10-CM | POA: Diagnosis not present

## 2016-08-06 DIAGNOSIS — C50511 Malignant neoplasm of lower-outer quadrant of right female breast: Secondary | ICD-10-CM | POA: Diagnosis not present

## 2016-08-06 DIAGNOSIS — N184 Chronic kidney disease, stage 4 (severe): Secondary | ICD-10-CM | POA: Diagnosis not present

## 2016-08-06 DIAGNOSIS — E1121 Type 2 diabetes mellitus with diabetic nephropathy: Secondary | ICD-10-CM | POA: Diagnosis not present

## 2016-08-06 DIAGNOSIS — Z0000 Encounter for general adult medical examination without abnormal findings: Secondary | ICD-10-CM | POA: Diagnosis not present

## 2016-09-01 DIAGNOSIS — D0511 Intraductal carcinoma in situ of right breast: Secondary | ICD-10-CM | POA: Diagnosis not present

## 2016-09-04 DIAGNOSIS — R922 Inconclusive mammogram: Secondary | ICD-10-CM | POA: Diagnosis not present

## 2016-09-04 DIAGNOSIS — C50211 Malignant neoplasm of upper-inner quadrant of right female breast: Secondary | ICD-10-CM | POA: Diagnosis not present

## 2016-09-04 DIAGNOSIS — Z9889 Other specified postprocedural states: Secondary | ICD-10-CM | POA: Diagnosis not present

## 2016-09-04 DIAGNOSIS — N6459 Other signs and symptoms in breast: Secondary | ICD-10-CM | POA: Diagnosis not present

## 2016-09-04 DIAGNOSIS — N6489 Other specified disorders of breast: Secondary | ICD-10-CM | POA: Diagnosis not present

## 2016-10-23 DIAGNOSIS — C50911 Malignant neoplasm of unspecified site of right female breast: Secondary | ICD-10-CM | POA: Diagnosis not present

## 2016-10-23 DIAGNOSIS — Z79811 Long term (current) use of aromatase inhibitors: Secondary | ICD-10-CM | POA: Diagnosis not present

## 2016-10-23 DIAGNOSIS — L7634 Postprocedural seroma of skin and subcutaneous tissue following other procedure: Secondary | ICD-10-CM | POA: Diagnosis not present

## 2016-10-23 DIAGNOSIS — C50211 Malignant neoplasm of upper-inner quadrant of right female breast: Secondary | ICD-10-CM | POA: Diagnosis not present

## 2016-10-23 DIAGNOSIS — M8580 Other specified disorders of bone density and structure, unspecified site: Secondary | ICD-10-CM | POA: Diagnosis not present

## 2016-10-23 DIAGNOSIS — Z170 Estrogen receptor positive status [ER+]: Secondary | ICD-10-CM | POA: Diagnosis not present

## 2016-10-23 DIAGNOSIS — N951 Menopausal and female climacteric states: Secondary | ICD-10-CM | POA: Diagnosis not present

## 2016-11-06 DIAGNOSIS — Z1389 Encounter for screening for other disorder: Secondary | ICD-10-CM | POA: Diagnosis not present

## 2016-11-06 DIAGNOSIS — Z0000 Encounter for general adult medical examination without abnormal findings: Secondary | ICD-10-CM | POA: Diagnosis not present

## 2016-11-06 DIAGNOSIS — N184 Chronic kidney disease, stage 4 (severe): Secondary | ICD-10-CM | POA: Diagnosis not present

## 2016-11-06 DIAGNOSIS — Z6826 Body mass index (BMI) 26.0-26.9, adult: Secondary | ICD-10-CM | POA: Diagnosis not present

## 2016-11-06 DIAGNOSIS — Z9181 History of falling: Secondary | ICD-10-CM | POA: Diagnosis not present

## 2016-11-12 DIAGNOSIS — H61002 Unspecified perichondritis of left external ear: Secondary | ICD-10-CM | POA: Diagnosis not present

## 2016-11-12 DIAGNOSIS — D485 Neoplasm of uncertain behavior of skin: Secondary | ICD-10-CM | POA: Diagnosis not present

## 2016-11-12 DIAGNOSIS — L570 Actinic keratosis: Secondary | ICD-10-CM | POA: Diagnosis not present

## 2016-11-26 DIAGNOSIS — M810 Age-related osteoporosis without current pathological fracture: Secondary | ICD-10-CM | POA: Diagnosis not present

## 2016-12-03 DIAGNOSIS — L219 Seborrheic dermatitis, unspecified: Secondary | ICD-10-CM | POA: Diagnosis not present

## 2016-12-03 DIAGNOSIS — H61002 Unspecified perichondritis of left external ear: Secondary | ICD-10-CM | POA: Diagnosis not present

## 2016-12-03 DIAGNOSIS — L720 Epidermal cyst: Secondary | ICD-10-CM | POA: Diagnosis not present

## 2017-01-01 DIAGNOSIS — Z23 Encounter for immunization: Secondary | ICD-10-CM | POA: Diagnosis not present

## 2017-01-15 DIAGNOSIS — Z6826 Body mass index (BMI) 26.0-26.9, adult: Secondary | ICD-10-CM | POA: Diagnosis not present

## 2017-01-15 DIAGNOSIS — E663 Overweight: Secondary | ICD-10-CM | POA: Diagnosis not present

## 2017-01-15 DIAGNOSIS — N184 Chronic kidney disease, stage 4 (severe): Secondary | ICD-10-CM | POA: Diagnosis not present

## 2017-01-16 DIAGNOSIS — R82998 Other abnormal findings in urine: Secondary | ICD-10-CM | POA: Diagnosis not present

## 2017-01-16 DIAGNOSIS — N184 Chronic kidney disease, stage 4 (severe): Secondary | ICD-10-CM | POA: Diagnosis not present

## 2017-01-22 DIAGNOSIS — Z79811 Long term (current) use of aromatase inhibitors: Secondary | ICD-10-CM | POA: Diagnosis not present

## 2017-01-22 DIAGNOSIS — Z853 Personal history of malignant neoplasm of breast: Secondary | ICD-10-CM | POA: Diagnosis not present

## 2017-02-05 DIAGNOSIS — L219 Seborrheic dermatitis, unspecified: Secondary | ICD-10-CM | POA: Diagnosis not present

## 2017-02-05 DIAGNOSIS — E11649 Type 2 diabetes mellitus with hypoglycemia without coma: Secondary | ICD-10-CM | POA: Diagnosis not present

## 2017-02-13 DIAGNOSIS — Z6826 Body mass index (BMI) 26.0-26.9, adult: Secondary | ICD-10-CM | POA: Diagnosis not present

## 2017-02-13 DIAGNOSIS — E1121 Type 2 diabetes mellitus with diabetic nephropathy: Secondary | ICD-10-CM | POA: Diagnosis not present

## 2017-02-13 DIAGNOSIS — Z853 Personal history of malignant neoplasm of breast: Secondary | ICD-10-CM | POA: Diagnosis not present

## 2017-04-22 DIAGNOSIS — C50911 Malignant neoplasm of unspecified site of right female breast: Secondary | ICD-10-CM | POA: Diagnosis not present

## 2017-04-22 DIAGNOSIS — Z79811 Long term (current) use of aromatase inhibitors: Secondary | ICD-10-CM | POA: Diagnosis not present

## 2017-04-22 DIAGNOSIS — Z853 Personal history of malignant neoplasm of breast: Secondary | ICD-10-CM | POA: Diagnosis not present

## 2017-05-13 DIAGNOSIS — R928 Other abnormal and inconclusive findings on diagnostic imaging of breast: Secondary | ICD-10-CM | POA: Diagnosis not present

## 2017-05-13 DIAGNOSIS — C50211 Malignant neoplasm of upper-inner quadrant of right female breast: Secondary | ICD-10-CM | POA: Diagnosis not present

## 2017-05-17 DIAGNOSIS — S42202A Unspecified fracture of upper end of left humerus, initial encounter for closed fracture: Secondary | ICD-10-CM | POA: Diagnosis not present

## 2017-05-17 DIAGNOSIS — S42252A Displaced fracture of greater tuberosity of left humerus, initial encounter for closed fracture: Secondary | ICD-10-CM | POA: Diagnosis not present

## 2017-05-17 DIAGNOSIS — S42302A Unspecified fracture of shaft of humerus, left arm, initial encounter for closed fracture: Secondary | ICD-10-CM | POA: Diagnosis not present

## 2017-05-21 DIAGNOSIS — S42252A Displaced fracture of greater tuberosity of left humerus, initial encounter for closed fracture: Secondary | ICD-10-CM | POA: Diagnosis not present

## 2017-05-25 DIAGNOSIS — S42224S 2-part nondisplaced fracture of surgical neck of right humerus, sequela: Secondary | ICD-10-CM | POA: Diagnosis not present

## 2017-05-28 DIAGNOSIS — M810 Age-related osteoporosis without current pathological fracture: Secondary | ICD-10-CM | POA: Diagnosis not present

## 2017-05-28 DIAGNOSIS — S42252A Displaced fracture of greater tuberosity of left humerus, initial encounter for closed fracture: Secondary | ICD-10-CM | POA: Diagnosis not present

## 2017-06-02 DIAGNOSIS — C50211 Malignant neoplasm of upper-inner quadrant of right female breast: Secondary | ICD-10-CM | POA: Diagnosis not present

## 2017-06-02 DIAGNOSIS — Z170 Estrogen receptor positive status [ER+]: Secondary | ICD-10-CM | POA: Diagnosis not present

## 2017-06-10 DIAGNOSIS — S42252A Displaced fracture of greater tuberosity of left humerus, initial encounter for closed fracture: Secondary | ICD-10-CM | POA: Diagnosis not present

## 2017-07-01 DIAGNOSIS — S42252A Displaced fracture of greater tuberosity of left humerus, initial encounter for closed fracture: Secondary | ICD-10-CM | POA: Diagnosis not present

## 2017-07-14 DIAGNOSIS — M25612 Stiffness of left shoulder, not elsewhere classified: Secondary | ICD-10-CM | POA: Diagnosis not present

## 2017-07-14 DIAGNOSIS — M25512 Pain in left shoulder: Secondary | ICD-10-CM | POA: Diagnosis not present

## 2017-07-22 DIAGNOSIS — M25512 Pain in left shoulder: Secondary | ICD-10-CM | POA: Diagnosis not present

## 2017-07-22 DIAGNOSIS — M25612 Stiffness of left shoulder, not elsewhere classified: Secondary | ICD-10-CM | POA: Diagnosis not present

## 2017-07-23 DIAGNOSIS — C50911 Malignant neoplasm of unspecified site of right female breast: Secondary | ICD-10-CM | POA: Diagnosis not present

## 2017-07-23 DIAGNOSIS — Z79811 Long term (current) use of aromatase inhibitors: Secondary | ICD-10-CM | POA: Diagnosis not present

## 2017-07-23 DIAGNOSIS — Z853 Personal history of malignant neoplasm of breast: Secondary | ICD-10-CM | POA: Diagnosis not present

## 2017-07-27 DIAGNOSIS — Z79899 Other long term (current) drug therapy: Secondary | ICD-10-CM | POA: Diagnosis not present

## 2017-07-27 DIAGNOSIS — E1121 Type 2 diabetes mellitus with diabetic nephropathy: Secondary | ICD-10-CM | POA: Diagnosis not present

## 2017-07-27 DIAGNOSIS — E039 Hypothyroidism, unspecified: Secondary | ICD-10-CM | POA: Diagnosis not present

## 2017-07-29 DIAGNOSIS — M25512 Pain in left shoulder: Secondary | ICD-10-CM | POA: Diagnosis not present

## 2017-07-29 DIAGNOSIS — S42252A Displaced fracture of greater tuberosity of left humerus, initial encounter for closed fracture: Secondary | ICD-10-CM | POA: Diagnosis not present

## 2017-07-29 DIAGNOSIS — M25612 Stiffness of left shoulder, not elsewhere classified: Secondary | ICD-10-CM | POA: Diagnosis not present

## 2017-08-04 DIAGNOSIS — M25612 Stiffness of left shoulder, not elsewhere classified: Secondary | ICD-10-CM | POA: Diagnosis not present

## 2017-08-04 DIAGNOSIS — M25512 Pain in left shoulder: Secondary | ICD-10-CM | POA: Diagnosis not present

## 2017-08-10 DIAGNOSIS — Z0000 Encounter for general adult medical examination without abnormal findings: Secondary | ICD-10-CM | POA: Diagnosis not present

## 2017-08-10 DIAGNOSIS — M25612 Stiffness of left shoulder, not elsewhere classified: Secondary | ICD-10-CM | POA: Diagnosis not present

## 2017-08-10 DIAGNOSIS — M25512 Pain in left shoulder: Secondary | ICD-10-CM | POA: Diagnosis not present

## 2017-08-10 DIAGNOSIS — Z1339 Encounter for screening examination for other mental health and behavioral disorders: Secondary | ICD-10-CM | POA: Diagnosis not present

## 2017-08-10 DIAGNOSIS — E1121 Type 2 diabetes mellitus with diabetic nephropathy: Secondary | ICD-10-CM | POA: Diagnosis not present

## 2017-08-10 DIAGNOSIS — Z6826 Body mass index (BMI) 26.0-26.9, adult: Secondary | ICD-10-CM | POA: Diagnosis not present

## 2017-08-14 DIAGNOSIS — M25512 Pain in left shoulder: Secondary | ICD-10-CM | POA: Diagnosis not present

## 2017-08-14 DIAGNOSIS — M25612 Stiffness of left shoulder, not elsewhere classified: Secondary | ICD-10-CM | POA: Diagnosis not present

## 2017-08-18 DIAGNOSIS — M25612 Stiffness of left shoulder, not elsewhere classified: Secondary | ICD-10-CM | POA: Diagnosis not present

## 2017-08-18 DIAGNOSIS — M25512 Pain in left shoulder: Secondary | ICD-10-CM | POA: Diagnosis not present

## 2017-08-26 DIAGNOSIS — M25512 Pain in left shoulder: Secondary | ICD-10-CM | POA: Diagnosis not present

## 2017-08-26 DIAGNOSIS — M25612 Stiffness of left shoulder, not elsewhere classified: Secondary | ICD-10-CM | POA: Diagnosis not present

## 2017-08-26 DIAGNOSIS — S42252A Displaced fracture of greater tuberosity of left humerus, initial encounter for closed fracture: Secondary | ICD-10-CM | POA: Diagnosis not present

## 2017-09-01 DIAGNOSIS — M25512 Pain in left shoulder: Secondary | ICD-10-CM | POA: Diagnosis not present

## 2017-09-01 DIAGNOSIS — M25612 Stiffness of left shoulder, not elsewhere classified: Secondary | ICD-10-CM | POA: Diagnosis not present

## 2017-09-08 DIAGNOSIS — M25612 Stiffness of left shoulder, not elsewhere classified: Secondary | ICD-10-CM | POA: Diagnosis not present

## 2017-09-08 DIAGNOSIS — M25512 Pain in left shoulder: Secondary | ICD-10-CM | POA: Diagnosis not present

## 2017-09-15 DIAGNOSIS — M25512 Pain in left shoulder: Secondary | ICD-10-CM | POA: Diagnosis not present

## 2017-09-15 DIAGNOSIS — M25612 Stiffness of left shoulder, not elsewhere classified: Secondary | ICD-10-CM | POA: Diagnosis not present

## 2017-09-23 DIAGNOSIS — M25512 Pain in left shoulder: Secondary | ICD-10-CM | POA: Diagnosis not present

## 2017-09-23 DIAGNOSIS — M25612 Stiffness of left shoulder, not elsewhere classified: Secondary | ICD-10-CM | POA: Diagnosis not present

## 2017-09-29 DIAGNOSIS — M25512 Pain in left shoulder: Secondary | ICD-10-CM | POA: Diagnosis not present

## 2017-09-29 DIAGNOSIS — M25612 Stiffness of left shoulder, not elsewhere classified: Secondary | ICD-10-CM | POA: Diagnosis not present

## 2017-10-07 DIAGNOSIS — R413 Other amnesia: Secondary | ICD-10-CM | POA: Diagnosis not present

## 2017-10-07 DIAGNOSIS — Z6826 Body mass index (BMI) 26.0-26.9, adult: Secondary | ICD-10-CM | POA: Diagnosis not present

## 2017-10-12 DIAGNOSIS — S42252A Displaced fracture of greater tuberosity of left humerus, initial encounter for closed fracture: Secondary | ICD-10-CM | POA: Diagnosis not present

## 2017-10-12 DIAGNOSIS — M25612 Stiffness of left shoulder, not elsewhere classified: Secondary | ICD-10-CM | POA: Diagnosis not present

## 2017-10-26 DIAGNOSIS — M8589 Other specified disorders of bone density and structure, multiple sites: Secondary | ICD-10-CM | POA: Diagnosis not present

## 2017-10-26 DIAGNOSIS — C50211 Malignant neoplasm of upper-inner quadrant of right female breast: Secondary | ICD-10-CM | POA: Diagnosis not present

## 2017-10-27 DIAGNOSIS — I639 Cerebral infarction, unspecified: Secondary | ICD-10-CM | POA: Diagnosis not present

## 2017-10-27 DIAGNOSIS — C50211 Malignant neoplasm of upper-inner quadrant of right female breast: Secondary | ICD-10-CM | POA: Diagnosis not present

## 2017-10-27 DIAGNOSIS — R2681 Unsteadiness on feet: Secondary | ICD-10-CM | POA: Diagnosis not present

## 2017-10-27 DIAGNOSIS — R4189 Other symptoms and signs involving cognitive functions and awareness: Secondary | ICD-10-CM | POA: Diagnosis not present

## 2017-11-04 DIAGNOSIS — E782 Mixed hyperlipidemia: Secondary | ICD-10-CM | POA: Diagnosis not present

## 2017-11-04 DIAGNOSIS — R4189 Other symptoms and signs involving cognitive functions and awareness: Secondary | ICD-10-CM | POA: Diagnosis not present

## 2017-11-04 DIAGNOSIS — R413 Other amnesia: Secondary | ICD-10-CM | POA: Diagnosis not present

## 2017-11-04 DIAGNOSIS — Z23 Encounter for immunization: Secondary | ICD-10-CM | POA: Diagnosis not present

## 2017-12-03 DIAGNOSIS — M810 Age-related osteoporosis without current pathological fracture: Secondary | ICD-10-CM | POA: Diagnosis not present

## 2018-01-08 DIAGNOSIS — G4733 Obstructive sleep apnea (adult) (pediatric): Secondary | ICD-10-CM | POA: Diagnosis not present

## 2018-01-08 DIAGNOSIS — E782 Mixed hyperlipidemia: Secondary | ICD-10-CM | POA: Diagnosis not present

## 2018-01-08 DIAGNOSIS — I130 Hypertensive heart and chronic kidney disease with heart failure and stage 1 through stage 4 chronic kidney disease, or unspecified chronic kidney disease: Secondary | ICD-10-CM | POA: Diagnosis not present

## 2018-01-08 DIAGNOSIS — R4189 Other symptoms and signs involving cognitive functions and awareness: Secondary | ICD-10-CM | POA: Diagnosis not present

## 2018-01-16 DIAGNOSIS — E782 Mixed hyperlipidemia: Secondary | ICD-10-CM | POA: Diagnosis not present

## 2018-01-16 DIAGNOSIS — I130 Hypertensive heart and chronic kidney disease with heart failure and stage 1 through stage 4 chronic kidney disease, or unspecified chronic kidney disease: Secondary | ICD-10-CM | POA: Diagnosis not present

## 2018-01-21 DIAGNOSIS — E1129 Type 2 diabetes mellitus with other diabetic kidney complication: Secondary | ICD-10-CM | POA: Diagnosis not present

## 2018-01-21 DIAGNOSIS — Z79899 Other long term (current) drug therapy: Secondary | ICD-10-CM | POA: Diagnosis not present

## 2018-01-21 DIAGNOSIS — E782 Mixed hyperlipidemia: Secondary | ICD-10-CM | POA: Diagnosis not present

## 2018-01-21 DIAGNOSIS — E1165 Type 2 diabetes mellitus with hyperglycemia: Secondary | ICD-10-CM | POA: Diagnosis not present

## 2018-01-21 DIAGNOSIS — E039 Hypothyroidism, unspecified: Secondary | ICD-10-CM | POA: Diagnosis not present

## 2018-01-25 DIAGNOSIS — C50211 Malignant neoplasm of upper-inner quadrant of right female breast: Secondary | ICD-10-CM | POA: Diagnosis not present

## 2018-01-25 DIAGNOSIS — Z170 Estrogen receptor positive status [ER+]: Secondary | ICD-10-CM | POA: Diagnosis not present

## 2018-01-25 DIAGNOSIS — F0390 Unspecified dementia without behavioral disturbance: Secondary | ICD-10-CM | POA: Diagnosis not present

## 2018-01-25 DIAGNOSIS — M8580 Other specified disorders of bone density and structure, unspecified site: Secondary | ICD-10-CM | POA: Diagnosis not present

## 2018-01-25 DIAGNOSIS — Z79811 Long term (current) use of aromatase inhibitors: Secondary | ICD-10-CM | POA: Diagnosis not present

## 2018-01-25 DIAGNOSIS — C50911 Malignant neoplasm of unspecified site of right female breast: Secondary | ICD-10-CM | POA: Diagnosis not present

## 2018-02-16 DIAGNOSIS — R413 Other amnesia: Secondary | ICD-10-CM | POA: Diagnosis not present

## 2018-02-16 DIAGNOSIS — M810 Age-related osteoporosis without current pathological fracture: Secondary | ICD-10-CM | POA: Diagnosis not present

## 2018-02-16 DIAGNOSIS — E1129 Type 2 diabetes mellitus with other diabetic kidney complication: Secondary | ICD-10-CM | POA: Diagnosis not present

## 2018-03-11 DIAGNOSIS — N183 Chronic kidney disease, stage 3 (moderate): Secondary | ICD-10-CM | POA: Diagnosis not present

## 2018-03-11 DIAGNOSIS — I10 Essential (primary) hypertension: Secondary | ICD-10-CM | POA: Diagnosis not present

## 2018-03-11 DIAGNOSIS — E039 Hypothyroidism, unspecified: Secondary | ICD-10-CM | POA: Diagnosis not present

## 2018-03-11 DIAGNOSIS — E1121 Type 2 diabetes mellitus with diabetic nephropathy: Secondary | ICD-10-CM | POA: Diagnosis not present

## 2018-04-06 DIAGNOSIS — L270 Generalized skin eruption due to drugs and medicaments taken internally: Secondary | ICD-10-CM | POA: Diagnosis not present

## 2018-04-17 DIAGNOSIS — E1121 Type 2 diabetes mellitus with diabetic nephropathy: Secondary | ICD-10-CM | POA: Diagnosis not present

## 2018-04-17 DIAGNOSIS — I10 Essential (primary) hypertension: Secondary | ICD-10-CM | POA: Diagnosis not present

## 2018-04-27 DIAGNOSIS — E782 Mixed hyperlipidemia: Secondary | ICD-10-CM | POA: Diagnosis not present

## 2018-04-27 DIAGNOSIS — E1121 Type 2 diabetes mellitus with diabetic nephropathy: Secondary | ICD-10-CM | POA: Diagnosis not present

## 2018-04-27 DIAGNOSIS — Z79899 Other long term (current) drug therapy: Secondary | ICD-10-CM | POA: Diagnosis not present

## 2018-05-03 DIAGNOSIS — C44222 Squamous cell carcinoma of skin of right ear and external auricular canal: Secondary | ICD-10-CM | POA: Diagnosis not present

## 2018-05-03 DIAGNOSIS — L219 Seborrheic dermatitis, unspecified: Secondary | ICD-10-CM | POA: Diagnosis not present

## 2018-05-03 DIAGNOSIS — C44229 Squamous cell carcinoma of skin of left ear and external auricular canal: Secondary | ICD-10-CM | POA: Diagnosis not present

## 2018-05-10 DIAGNOSIS — L570 Actinic keratosis: Secondary | ICD-10-CM | POA: Diagnosis not present

## 2018-05-18 DIAGNOSIS — E1121 Type 2 diabetes mellitus with diabetic nephropathy: Secondary | ICD-10-CM | POA: Diagnosis not present

## 2018-05-18 DIAGNOSIS — I10 Essential (primary) hypertension: Secondary | ICD-10-CM | POA: Diagnosis not present

## 2018-06-17 DIAGNOSIS — E782 Mixed hyperlipidemia: Secondary | ICD-10-CM | POA: Diagnosis not present

## 2018-06-17 DIAGNOSIS — E1121 Type 2 diabetes mellitus with diabetic nephropathy: Secondary | ICD-10-CM | POA: Diagnosis not present

## 2018-06-21 DIAGNOSIS — E1121 Type 2 diabetes mellitus with diabetic nephropathy: Secondary | ICD-10-CM | POA: Diagnosis not present

## 2018-06-21 DIAGNOSIS — M810 Age-related osteoporosis without current pathological fracture: Secondary | ICD-10-CM | POA: Diagnosis not present

## 2018-07-01 DIAGNOSIS — L0390 Cellulitis, unspecified: Secondary | ICD-10-CM | POA: Diagnosis not present

## 2018-07-01 DIAGNOSIS — L570 Actinic keratosis: Secondary | ICD-10-CM | POA: Diagnosis not present

## 2018-07-05 DIAGNOSIS — E1121 Type 2 diabetes mellitus with diabetic nephropathy: Secondary | ICD-10-CM | POA: Diagnosis not present

## 2018-07-17 DIAGNOSIS — N183 Chronic kidney disease, stage 3 (moderate): Secondary | ICD-10-CM | POA: Diagnosis not present

## 2018-07-17 DIAGNOSIS — I10 Essential (primary) hypertension: Secondary | ICD-10-CM | POA: Diagnosis not present

## 2018-08-11 DIAGNOSIS — R922 Inconclusive mammogram: Secondary | ICD-10-CM | POA: Diagnosis not present

## 2018-08-11 DIAGNOSIS — Z853 Personal history of malignant neoplasm of breast: Secondary | ICD-10-CM | POA: Diagnosis not present

## 2018-08-11 DIAGNOSIS — C50211 Malignant neoplasm of upper-inner quadrant of right female breast: Secondary | ICD-10-CM | POA: Diagnosis not present

## 2018-08-12 DIAGNOSIS — L570 Actinic keratosis: Secondary | ICD-10-CM | POA: Diagnosis not present

## 2018-08-13 DIAGNOSIS — E559 Vitamin D deficiency, unspecified: Secondary | ICD-10-CM | POA: Diagnosis not present

## 2018-08-13 DIAGNOSIS — M810 Age-related osteoporosis without current pathological fracture: Secondary | ICD-10-CM | POA: Diagnosis not present

## 2018-08-13 DIAGNOSIS — Z79899 Other long term (current) drug therapy: Secondary | ICD-10-CM | POA: Diagnosis not present

## 2018-08-13 DIAGNOSIS — Z780 Asymptomatic menopausal state: Secondary | ICD-10-CM | POA: Diagnosis not present

## 2018-08-13 DIAGNOSIS — N183 Chronic kidney disease, stage 3 (moderate): Secondary | ICD-10-CM | POA: Diagnosis not present

## 2018-08-13 DIAGNOSIS — E039 Hypothyroidism, unspecified: Secondary | ICD-10-CM | POA: Diagnosis not present

## 2018-08-13 DIAGNOSIS — E1121 Type 2 diabetes mellitus with diabetic nephropathy: Secondary | ICD-10-CM | POA: Diagnosis not present

## 2018-08-13 DIAGNOSIS — Z0000 Encounter for general adult medical examination without abnormal findings: Secondary | ICD-10-CM | POA: Diagnosis not present

## 2018-08-13 DIAGNOSIS — E785 Hyperlipidemia, unspecified: Secondary | ICD-10-CM | POA: Diagnosis not present

## 2018-08-13 DIAGNOSIS — I10 Essential (primary) hypertension: Secondary | ICD-10-CM | POA: Diagnosis not present

## 2018-08-23 DIAGNOSIS — C50211 Malignant neoplasm of upper-inner quadrant of right female breast: Secondary | ICD-10-CM | POA: Diagnosis not present

## 2018-08-23 DIAGNOSIS — Z170 Estrogen receptor positive status [ER+]: Secondary | ICD-10-CM | POA: Diagnosis not present

## 2018-11-17 DIAGNOSIS — I10 Essential (primary) hypertension: Secondary | ICD-10-CM | POA: Diagnosis not present

## 2018-11-17 DIAGNOSIS — E785 Hyperlipidemia, unspecified: Secondary | ICD-10-CM | POA: Diagnosis not present

## 2018-11-17 DIAGNOSIS — N183 Chronic kidney disease, stage 3 (moderate): Secondary | ICD-10-CM | POA: Diagnosis not present

## 2018-11-17 DIAGNOSIS — E1121 Type 2 diabetes mellitus with diabetic nephropathy: Secondary | ICD-10-CM | POA: Diagnosis not present

## 2019-01-31 DIAGNOSIS — E785 Hyperlipidemia, unspecified: Secondary | ICD-10-CM | POA: Diagnosis not present

## 2019-01-31 DIAGNOSIS — Z79899 Other long term (current) drug therapy: Secondary | ICD-10-CM | POA: Diagnosis not present

## 2019-01-31 DIAGNOSIS — I10 Essential (primary) hypertension: Secondary | ICD-10-CM | POA: Diagnosis not present

## 2019-01-31 DIAGNOSIS — E1121 Type 2 diabetes mellitus with diabetic nephropathy: Secondary | ICD-10-CM | POA: Diagnosis not present

## 2019-01-31 DIAGNOSIS — Z23 Encounter for immunization: Secondary | ICD-10-CM | POA: Diagnosis not present

## 2019-01-31 DIAGNOSIS — E039 Hypothyroidism, unspecified: Secondary | ICD-10-CM | POA: Diagnosis not present

## 2019-02-01 DIAGNOSIS — M810 Age-related osteoporosis without current pathological fracture: Secondary | ICD-10-CM | POA: Diagnosis not present

## 2019-02-08 ENCOUNTER — Encounter: Payer: Self-pay | Admitting: Gastroenterology

## 2019-07-04 DIAGNOSIS — C50211 Malignant neoplasm of upper-inner quadrant of right female breast: Secondary | ICD-10-CM

## 2019-07-04 DIAGNOSIS — M8589 Other specified disorders of bone density and structure, multiple sites: Secondary | ICD-10-CM

## 2020-02-21 DIAGNOSIS — Z6832 Body mass index (BMI) 32.0-32.9, adult: Secondary | ICD-10-CM | POA: Diagnosis not present

## 2020-02-21 DIAGNOSIS — Z170 Estrogen receptor positive status [ER+]: Secondary | ICD-10-CM | POA: Diagnosis not present

## 2020-02-21 DIAGNOSIS — C50211 Malignant neoplasm of upper-inner quadrant of right female breast: Secondary | ICD-10-CM | POA: Diagnosis not present

## 2020-04-05 DIAGNOSIS — E1121 Type 2 diabetes mellitus with diabetic nephropathy: Secondary | ICD-10-CM | POA: Diagnosis not present

## 2020-04-05 DIAGNOSIS — N1832 Chronic kidney disease, stage 3b: Secondary | ICD-10-CM | POA: Diagnosis not present

## 2020-04-05 DIAGNOSIS — I10 Essential (primary) hypertension: Secondary | ICD-10-CM | POA: Diagnosis not present

## 2020-04-05 DIAGNOSIS — M7062 Trochanteric bursitis, left hip: Secondary | ICD-10-CM | POA: Diagnosis not present

## 2020-06-27 ENCOUNTER — Encounter: Payer: Self-pay | Admitting: Hematology and Oncology

## 2020-06-27 ENCOUNTER — Inpatient Hospital Stay: Payer: Medicare Other | Attending: Hematology and Oncology | Admitting: Hematology and Oncology

## 2020-06-27 NOTE — Progress Notes (Deleted)
[facility]  [address] [place],  [place]  [address] [phone]  Clinic Day:  06/27/2020  Referring physician: No ref. provider found   CHIEF COMPLAINT:  CC:   Stage II A hormone receptor positive breast cancer  Current Treatment:   Observation    HISTORY OF PRESENT ILLNESS:  Kathy Ho is a 80 y.o. female with stage IIA (T2 N0 M0) hormone receptor positive right breast cancer diagnosed in April 2018.  Biopsy revealed grade 2, invasive ductal carcinoma with positive estrogen and progesterone receptors and negative her 2 Neu.  Ki 67 was 7%.  She was treated with lumpectomy.  Pathology revealed a 2.4 cm, grade 2, invasive ductal carcinoma in the background of intermediate grade ductal carcinoma in situ.  Two sentinel nodes were negative for metastasis.  EndoPredict EPclin score was 2.5, which is in the low risk category and corresponds to 4.6% risk of distant recurrence in the next 10 years with hormonal therapy alone.  Therefore, she was not felt to require chemotherapy or radiotherapy.  She has osteopenia, for which she is on Prolia.  She had been on calcium and vitamin-D, but this was subsequently discontinued due to hypercalcemia.  Bone density scan in March 2018 revealed improvement in her osteopenia with a T-score of -1.9 in the spine and a T-score -1.6 in the femur.  She was placed on anastrozole 1 mg daily in June 2018, but did not tolerate this very well due to mood changes and hot flashes.  She did not wish to try alternative medication, so continued anastrozole.  In July 2018, she had firmness in the right upper breast along the incision.  Right diagnostic mammogram and ultrasound revealed a 2.1 cm seroma, with diffuse skin thickening, but no evidence of malignancy.    Bilateral diagnostic mammogram in March 2019 did not reveal any evidence of malignancy.  Her husband contacted our office in August reporting memory difficulties.   They had seen Dr. Nicki Reaper and stated he asked if she could stop the anastrozole.  Her calcium level was elevated, but the remainder of the tests were normal.  Dr. Nicki Reaper recommended discontinuing her calcium and vitamin-D supplements.  Anastrozole was discontinued at that time.  At her visit in September, the hot flashes and mood changes had improved, but she continued to have difficulty with memory with episodes of confusion.  We obtained a CT head which did not reveal any evidence of intracranial metastasis or other acute intracranial abnormality.  There were age indeterminate lacunar infarcts in the basal ganglia and thalami, which were felt to be possibly chronic.  Mild chronic small-vessel ischemic white matter disease was seen.  At that time, we kept her off anastrozole.  Dr. Nicki Reaper ultimately place the patient on donepezil.   At her visit in December 2019, we recommended trying letrozole 2.5 mg daily.  The patient did not have trouble initially, but then was seen by Dr. Helene Kelp in February 2020 and had a generalized rash, that was attributed to letrozole, so letrozole was discontinued.  The patient did not wish to try alternative treatment.  Annual mammogram in June 2020 did not reveal any evidence of malignancy.  INTERVAL HISTORY:  Kathy Ho is here today for annual examination.  Bilateral diagnostic mammogram in June 2021 revealed a probably benign right breast assess asymmetry, with a  5 mm hypoechoic mass in the 3 o'clock position on ultrasound, which was felt to potentially be fat necrosis.  Follow-up right  diagnostic mammogram and ultrasound in 6 months was recommended.  Right diagnostic mammogram and ultrasound in December 2021 was stable.  Six-month follow-up was once again recommended. She denies fevers or chills. She denies pain. Her appetite is good. Her weight {Weight change:10426}.  She has routine blood work at her primary care office. She continues Prolia there as well. She continues to follow Dr.  Noberto Retort and is scheduled for bilateral diagnostic mammogram and right breast ultrasound in June.  REVIEW OF SYSTEMS:  Review of Systems - Oncology   VITALS:  There were no vitals taken for this visit.  Wt Readings from Last 3 Encounters:  No data found for Wt    There is no height or weight on file to calculate BMI.  Performance status (ECOG): {CHL ONC [id number]  PHYSICAL EXAM:  Physical Exam  LABS:  No flowsheet data found. No flowsheet data found.   No results found for: CEA1 / No results found for: CEA1 No results found for: PSA1 No results found for: LPF790 No results found for: CAN125  No results found for: TOTALPROTELP, ALBUMINELP, A1GS, A2GS, BETS, BETA2SER, GAMS, MSPIKE, SPEI No results found for: TIBC, FERRITIN, IRONPCTSAT No results found for: LDH  STUDIES:  No results found.  Exam(s): [id number] MAM/MAM DIGITAL W/TOMO DIAG R CLINICAL DATA:  Six-month follow-up of a right breast mass  EXAM: DIGITAL DIAGNOSTIC RIGHT MAMMOGRAM WITH CAD AND TOMO  ULTRASOUND RIGHT BREAST  COMPARISON:  Previous exam(s).  ACR Breast Density Category b: There are scattered areas of fibroglandular density.  FINDINGS: The asymmetry in the medial right breast is slightly less conspicuous compared to August 12, 2019 but does persist. No other suspicious findings on the right. Postoperative changes are seen on the right.  Mammographic images were processed with CAD.  On physical exam, no suspicious lumps are identified.  Targeted ultrasound is performed, showing a stable mass in the right breast at 3 o'clock, 4 cm from the nipple measuring 5 x 4 x 5 mm today, not significantly changed in the interval.  IMPRESSION: Stable right breast mass.  No other suspicious findings.  RECOMMENDATION: Six-month follow-up mammography and ultrasound of the probably benign right breast mass. The patient will be due for bilateral mammography at that time.  I have discussed the  findings and recommendations with the patient. If applicable, a reminder letter will be sent to the patient regarding the next appointment.  BI-RADS CATEGORY  3: Probably benign.  HISTORY:   Past Medical History:  Diagnosis Date  . Diabetes ([place])   . Hypertension   . Sleep apnea   . Thyroid disorder     *** The histories are not reviewed yet. Please review them in the "History" navigator section and refresh this [place].  No family history on file.  Social History:  reports that she has never smoked. She has never used smokeless tobacco. No history on file for alcohol use and drug use.The patient is {Blank single:19197::"alone","accompanied by"} *** today.  Allergies:  Allergies  Allergen Reactions  . Diclofenac-Misoprostol Other (See Comments)    Unknown  . Fenofibrate Micronized Other (See Comments)    Pain  . Gabapentin Other (See Comments)    Unknown  . Anastrozole Rash    Pt reports hives head to toe    Current Medications: Current Outpatient Medications  Medication Sig Dispense Refill  . fenofibrate 160 MG tablet     . ibuprofen (ADVIL) 600 MG tablet     . rosuvastatin (CRESTOR) 10 MG tablet Take  by mouth.    . Cholecalciferol 125 MCG (5000 UT) TABS Take by mouth.    . Denosumab (PROLIA [place]) Inject into the skin.    Javier Docker Oil (OMEGA-3) 500 MG CAPS Take by mouth.    . levothyroxine (SYNTHROID) 100 MCG tablet Take by mouth.    . losartan (COZAAR) 25 MG tablet Take 25 mg by mouth daily.    . Multiple Vitamins-Minerals (CENTRUM SILVER PO) Take by mouth.    . pioglitazone (ACTOS) 15 MG tablet Take 15 mg by mouth daily.     No current facility-administered medications for this visit.     ASSESSMENT & PLAN:   Assessment:  Kathy Ho is a 80 y.o. female ***  Plan: ***  The patient understands the plans discussed today and is in agreement with them.  She knows to contact our office if she develops concerns prior to her next  appointment.   I provided *** minutes (9:25 AM - 9:25 AM) of face-to-face time during this this encounter and > 50% was spent counseling as documented under my assessment and plan.    Marvia Pickles, PA-C

## 2020-07-02 DIAGNOSIS — E1121 Type 2 diabetes mellitus with diabetic nephropathy: Secondary | ICD-10-CM | POA: Diagnosis not present

## 2020-07-02 DIAGNOSIS — E785 Hyperlipidemia, unspecified: Secondary | ICD-10-CM | POA: Diagnosis not present

## 2020-07-02 DIAGNOSIS — G720 Drug-induced myopathy: Secondary | ICD-10-CM | POA: Diagnosis not present

## 2020-07-02 DIAGNOSIS — C50919 Malignant neoplasm of unspecified site of unspecified female breast: Secondary | ICD-10-CM | POA: Diagnosis not present

## 2020-07-02 DIAGNOSIS — M810 Age-related osteoporosis without current pathological fracture: Secondary | ICD-10-CM | POA: Diagnosis not present

## 2020-08-21 DIAGNOSIS — T466X5A Adverse effect of antihyperlipidemic and antiarteriosclerotic drugs, initial encounter: Secondary | ICD-10-CM | POA: Diagnosis not present

## 2020-08-21 DIAGNOSIS — E039 Hypothyroidism, unspecified: Secondary | ICD-10-CM | POA: Diagnosis not present

## 2020-08-21 DIAGNOSIS — E1121 Type 2 diabetes mellitus with diabetic nephropathy: Secondary | ICD-10-CM | POA: Diagnosis not present

## 2020-08-21 DIAGNOSIS — Z79899 Other long term (current) drug therapy: Secondary | ICD-10-CM | POA: Diagnosis not present

## 2020-08-21 DIAGNOSIS — G720 Drug-induced myopathy: Secondary | ICD-10-CM | POA: Diagnosis not present

## 2020-08-21 DIAGNOSIS — E785 Hyperlipidemia, unspecified: Secondary | ICD-10-CM | POA: Diagnosis not present

## 2020-08-21 DIAGNOSIS — Z0000 Encounter for general adult medical examination without abnormal findings: Secondary | ICD-10-CM | POA: Diagnosis not present

## 2020-08-21 DIAGNOSIS — M810 Age-related osteoporosis without current pathological fracture: Secondary | ICD-10-CM | POA: Diagnosis not present

## 2020-08-26 DIAGNOSIS — Z20822 Contact with and (suspected) exposure to covid-19: Secondary | ICD-10-CM | POA: Diagnosis not present

## 2020-10-03 DIAGNOSIS — R928 Other abnormal and inconclusive findings on diagnostic imaging of breast: Secondary | ICD-10-CM | POA: Diagnosis not present

## 2020-10-03 DIAGNOSIS — N6314 Unspecified lump in the right breast, lower inner quadrant: Secondary | ICD-10-CM | POA: Diagnosis not present

## 2020-10-03 DIAGNOSIS — N6312 Unspecified lump in the right breast, upper inner quadrant: Secondary | ICD-10-CM | POA: Diagnosis not present

## 2020-10-16 DIAGNOSIS — Z6831 Body mass index (BMI) 31.0-31.9, adult: Secondary | ICD-10-CM | POA: Diagnosis not present

## 2020-10-16 DIAGNOSIS — C50211 Malignant neoplasm of upper-inner quadrant of right female breast: Secondary | ICD-10-CM | POA: Diagnosis not present

## 2020-10-16 DIAGNOSIS — Z170 Estrogen receptor positive status [ER+]: Secondary | ICD-10-CM | POA: Diagnosis not present

## 2021-01-22 DIAGNOSIS — M810 Age-related osteoporosis without current pathological fracture: Secondary | ICD-10-CM | POA: Diagnosis not present

## 2021-01-22 DIAGNOSIS — E1121 Type 2 diabetes mellitus with diabetic nephropathy: Secondary | ICD-10-CM | POA: Diagnosis not present

## 2021-01-22 DIAGNOSIS — T466X5A Adverse effect of antihyperlipidemic and antiarteriosclerotic drugs, initial encounter: Secondary | ICD-10-CM | POA: Diagnosis not present

## 2021-01-22 DIAGNOSIS — G720 Drug-induced myopathy: Secondary | ICD-10-CM | POA: Diagnosis not present

## 2021-01-22 DIAGNOSIS — E785 Hyperlipidemia, unspecified: Secondary | ICD-10-CM | POA: Diagnosis not present

## 2021-01-22 DIAGNOSIS — E039 Hypothyroidism, unspecified: Secondary | ICD-10-CM | POA: Diagnosis not present

## 2021-01-22 DIAGNOSIS — Z23 Encounter for immunization: Secondary | ICD-10-CM | POA: Diagnosis not present

## 2021-01-22 DIAGNOSIS — Z79899 Other long term (current) drug therapy: Secondary | ICD-10-CM | POA: Diagnosis not present

## 2021-01-24 DIAGNOSIS — Z20822 Contact with and (suspected) exposure to covid-19: Secondary | ICD-10-CM | POA: Diagnosis not present

## 2021-04-15 DIAGNOSIS — Z20822 Contact with and (suspected) exposure to covid-19: Secondary | ICD-10-CM | POA: Diagnosis not present

## 2021-04-29 DIAGNOSIS — L853 Xerosis cutis: Secondary | ICD-10-CM | POA: Diagnosis not present

## 2021-04-29 DIAGNOSIS — B353 Tinea pedis: Secondary | ICD-10-CM | POA: Diagnosis not present

## 2021-05-17 DIAGNOSIS — Z20822 Contact with and (suspected) exposure to covid-19: Secondary | ICD-10-CM | POA: Diagnosis not present

## 2021-05-23 DIAGNOSIS — Z20822 Contact with and (suspected) exposure to covid-19: Secondary | ICD-10-CM | POA: Diagnosis not present

## 2021-06-03 DIAGNOSIS — Z20822 Contact with and (suspected) exposure to covid-19: Secondary | ICD-10-CM | POA: Diagnosis not present

## 2021-06-22 DIAGNOSIS — Z20822 Contact with and (suspected) exposure to covid-19: Secondary | ICD-10-CM | POA: Diagnosis not present

## 2021-06-24 DIAGNOSIS — Z20822 Contact with and (suspected) exposure to covid-19: Secondary | ICD-10-CM | POA: Diagnosis not present

## 2021-07-01 DIAGNOSIS — R4181 Age-related cognitive decline: Secondary | ICD-10-CM | POA: Diagnosis not present

## 2021-07-01 DIAGNOSIS — N184 Chronic kidney disease, stage 4 (severe): Secondary | ICD-10-CM | POA: Diagnosis not present

## 2021-07-01 DIAGNOSIS — R82998 Other abnormal findings in urine: Secondary | ICD-10-CM | POA: Diagnosis not present

## 2021-07-01 DIAGNOSIS — R1032 Left lower quadrant pain: Secondary | ICD-10-CM | POA: Diagnosis not present

## 2021-08-01 DIAGNOSIS — E1121 Type 2 diabetes mellitus with diabetic nephropathy: Secondary | ICD-10-CM | POA: Diagnosis not present

## 2021-08-01 DIAGNOSIS — F01B0 Vascular dementia, moderate, without behavioral disturbance, psychotic disturbance, mood disturbance, and anxiety: Secondary | ICD-10-CM | POA: Diagnosis not present

## 2021-08-01 DIAGNOSIS — E785 Hyperlipidemia, unspecified: Secondary | ICD-10-CM | POA: Diagnosis not present

## 2021-08-01 DIAGNOSIS — Z79899 Other long term (current) drug therapy: Secondary | ICD-10-CM | POA: Diagnosis not present

## 2021-08-01 DIAGNOSIS — R0609 Other forms of dyspnea: Secondary | ICD-10-CM | POA: Diagnosis not present

## 2021-08-05 DIAGNOSIS — E1121 Type 2 diabetes mellitus with diabetic nephropathy: Secondary | ICD-10-CM | POA: Diagnosis not present

## 2021-08-07 DIAGNOSIS — R0609 Other forms of dyspnea: Secondary | ICD-10-CM | POA: Diagnosis not present

## 2021-08-07 DIAGNOSIS — I340 Nonrheumatic mitral (valve) insufficiency: Secondary | ICD-10-CM | POA: Diagnosis not present

## 2021-08-12 DIAGNOSIS — F01B0 Vascular dementia, moderate, without behavioral disturbance, psychotic disturbance, mood disturbance, and anxiety: Secondary | ICD-10-CM | POA: Diagnosis not present

## 2021-08-12 DIAGNOSIS — Z6831 Body mass index (BMI) 31.0-31.9, adult: Secondary | ICD-10-CM | POA: Diagnosis not present

## 2021-08-12 DIAGNOSIS — N184 Chronic kidney disease, stage 4 (severe): Secondary | ICD-10-CM | POA: Diagnosis not present

## 2021-08-12 DIAGNOSIS — N3000 Acute cystitis without hematuria: Secondary | ICD-10-CM | POA: Diagnosis not present

## 2021-08-13 DIAGNOSIS — K5641 Fecal impaction: Secondary | ICD-10-CM | POA: Diagnosis not present

## 2021-08-13 DIAGNOSIS — R112 Nausea with vomiting, unspecified: Secondary | ICD-10-CM | POA: Diagnosis not present

## 2021-08-13 DIAGNOSIS — K5900 Constipation, unspecified: Secondary | ICD-10-CM | POA: Diagnosis not present

## 2021-08-13 DIAGNOSIS — K5289 Other specified noninfective gastroenteritis and colitis: Secondary | ICD-10-CM | POA: Diagnosis not present

## 2021-08-13 DIAGNOSIS — R109 Unspecified abdominal pain: Secondary | ICD-10-CM | POA: Diagnosis not present

## 2021-08-13 DIAGNOSIS — N281 Cyst of kidney, acquired: Secondary | ICD-10-CM | POA: Diagnosis not present

## 2021-08-13 DIAGNOSIS — K529 Noninfective gastroenteritis and colitis, unspecified: Secondary | ICD-10-CM | POA: Diagnosis not present

## 2021-08-13 DIAGNOSIS — K5730 Diverticulosis of large intestine without perforation or abscess without bleeding: Secondary | ICD-10-CM | POA: Diagnosis not present

## 2021-08-14 DIAGNOSIS — R109 Unspecified abdominal pain: Secondary | ICD-10-CM | POA: Diagnosis not present

## 2021-08-28 DIAGNOSIS — R81 Glycosuria: Secondary | ICD-10-CM | POA: Diagnosis not present

## 2021-08-28 DIAGNOSIS — R300 Dysuria: Secondary | ICD-10-CM | POA: Diagnosis not present

## 2021-09-02 DIAGNOSIS — R9430 Abnormal result of cardiovascular function study, unspecified: Secondary | ICD-10-CM | POA: Diagnosis not present

## 2021-09-02 DIAGNOSIS — K5901 Slow transit constipation: Secondary | ICD-10-CM | POA: Diagnosis not present

## 2021-09-02 DIAGNOSIS — F01B0 Vascular dementia, moderate, without behavioral disturbance, psychotic disturbance, mood disturbance, and anxiety: Secondary | ICD-10-CM | POA: Diagnosis not present

## 2021-09-02 DIAGNOSIS — G4733 Obstructive sleep apnea (adult) (pediatric): Secondary | ICD-10-CM | POA: Diagnosis not present

## 2021-09-03 DIAGNOSIS — Z888 Allergy status to other drugs, medicaments and biological substances status: Secondary | ICD-10-CM | POA: Diagnosis not present

## 2021-09-03 DIAGNOSIS — K5730 Diverticulosis of large intestine without perforation or abscess without bleeding: Secondary | ICD-10-CM | POA: Diagnosis not present

## 2021-09-03 DIAGNOSIS — N183 Chronic kidney disease, stage 3 unspecified: Secondary | ICD-10-CM | POA: Diagnosis not present

## 2021-09-03 DIAGNOSIS — F0390 Unspecified dementia without behavioral disturbance: Secondary | ICD-10-CM | POA: Diagnosis not present

## 2021-09-03 DIAGNOSIS — K5909 Other constipation: Secondary | ICD-10-CM | POA: Diagnosis not present

## 2021-09-03 DIAGNOSIS — N281 Cyst of kidney, acquired: Secondary | ICD-10-CM | POA: Diagnosis not present

## 2021-09-03 DIAGNOSIS — R410 Disorientation, unspecified: Secondary | ICD-10-CM | POA: Diagnosis not present

## 2021-09-03 DIAGNOSIS — G9341 Metabolic encephalopathy: Secondary | ICD-10-CM | POA: Diagnosis not present

## 2021-09-03 DIAGNOSIS — R109 Unspecified abdominal pain: Secondary | ICD-10-CM | POA: Diagnosis not present

## 2021-09-03 DIAGNOSIS — Z79899 Other long term (current) drug therapy: Secondary | ICD-10-CM | POA: Diagnosis not present

## 2021-09-03 DIAGNOSIS — N179 Acute kidney failure, unspecified: Secondary | ICD-10-CM | POA: Diagnosis not present

## 2021-09-03 DIAGNOSIS — K5900 Constipation, unspecified: Secondary | ICD-10-CM | POA: Diagnosis not present

## 2021-09-03 DIAGNOSIS — E860 Dehydration: Secondary | ICD-10-CM | POA: Diagnosis not present

## 2021-09-03 DIAGNOSIS — E7800 Pure hypercholesterolemia, unspecified: Secondary | ICD-10-CM | POA: Diagnosis not present

## 2021-09-03 DIAGNOSIS — J9811 Atelectasis: Secondary | ICD-10-CM | POA: Diagnosis not present

## 2021-09-03 DIAGNOSIS — E1165 Type 2 diabetes mellitus with hyperglycemia: Secondary | ICD-10-CM | POA: Diagnosis not present

## 2021-09-03 DIAGNOSIS — Z792 Long term (current) use of antibiotics: Secondary | ICD-10-CM | POA: Diagnosis not present

## 2021-09-03 DIAGNOSIS — C50911 Malignant neoplasm of unspecified site of right female breast: Secondary | ICD-10-CM | POA: Diagnosis not present

## 2021-09-03 DIAGNOSIS — Z66 Do not resuscitate: Secondary | ICD-10-CM | POA: Diagnosis not present

## 2021-09-03 DIAGNOSIS — E039 Hypothyroidism, unspecified: Secondary | ICD-10-CM | POA: Diagnosis not present

## 2021-09-03 DIAGNOSIS — Z7952 Long term (current) use of systemic steroids: Secondary | ICD-10-CM | POA: Diagnosis not present

## 2021-09-03 DIAGNOSIS — E1122 Type 2 diabetes mellitus with diabetic chronic kidney disease: Secondary | ICD-10-CM | POA: Diagnosis not present

## 2021-09-03 DIAGNOSIS — Z7984 Long term (current) use of oral hypoglycemic drugs: Secondary | ICD-10-CM | POA: Diagnosis not present

## 2021-09-03 DIAGNOSIS — I129 Hypertensive chronic kidney disease with stage 1 through stage 4 chronic kidney disease, or unspecified chronic kidney disease: Secondary | ICD-10-CM | POA: Diagnosis not present

## 2021-09-03 DIAGNOSIS — N390 Urinary tract infection, site not specified: Secondary | ICD-10-CM | POA: Diagnosis not present

## 2021-09-08 DIAGNOSIS — F0390 Unspecified dementia without behavioral disturbance: Secondary | ICD-10-CM | POA: Diagnosis not present

## 2021-09-08 DIAGNOSIS — E119 Type 2 diabetes mellitus without complications: Secondary | ICD-10-CM | POA: Diagnosis not present

## 2021-09-11 DIAGNOSIS — E1122 Type 2 diabetes mellitus with diabetic chronic kidney disease: Secondary | ICD-10-CM | POA: Diagnosis not present

## 2021-09-11 DIAGNOSIS — F0280 Dementia in other diseases classified elsewhere without behavioral disturbance: Secondary | ICD-10-CM | POA: Diagnosis not present

## 2021-09-11 DIAGNOSIS — R569 Unspecified convulsions: Secondary | ICD-10-CM | POA: Diagnosis not present

## 2021-09-11 DIAGNOSIS — Z853 Personal history of malignant neoplasm of breast: Secondary | ICD-10-CM | POA: Diagnosis not present

## 2021-09-11 DIAGNOSIS — N183 Chronic kidney disease, stage 3 unspecified: Secondary | ICD-10-CM | POA: Diagnosis not present

## 2021-09-11 DIAGNOSIS — Z9181 History of falling: Secondary | ICD-10-CM | POA: Diagnosis not present

## 2021-09-11 DIAGNOSIS — Z792 Long term (current) use of antibiotics: Secondary | ICD-10-CM | POA: Diagnosis not present

## 2021-09-11 DIAGNOSIS — E7800 Pure hypercholesterolemia, unspecified: Secondary | ICD-10-CM | POA: Diagnosis not present

## 2021-09-11 DIAGNOSIS — Z90710 Acquired absence of both cervix and uterus: Secondary | ICD-10-CM | POA: Diagnosis not present

## 2021-09-11 DIAGNOSIS — I2510 Atherosclerotic heart disease of native coronary artery without angina pectoris: Secondary | ICD-10-CM | POA: Diagnosis not present

## 2021-09-11 DIAGNOSIS — Z8542 Personal history of malignant neoplasm of other parts of uterus: Secondary | ICD-10-CM | POA: Diagnosis not present

## 2021-09-11 DIAGNOSIS — K5900 Constipation, unspecified: Secondary | ICD-10-CM | POA: Diagnosis not present

## 2021-09-11 DIAGNOSIS — E1165 Type 2 diabetes mellitus with hyperglycemia: Secondary | ICD-10-CM | POA: Diagnosis not present

## 2021-09-11 DIAGNOSIS — Z87442 Personal history of urinary calculi: Secondary | ICD-10-CM | POA: Diagnosis not present

## 2021-09-11 DIAGNOSIS — I700 Atherosclerosis of aorta: Secondary | ICD-10-CM | POA: Diagnosis not present

## 2021-09-11 DIAGNOSIS — N390 Urinary tract infection, site not specified: Secondary | ICD-10-CM | POA: Diagnosis not present

## 2021-09-11 DIAGNOSIS — N179 Acute kidney failure, unspecified: Secondary | ICD-10-CM | POA: Diagnosis not present

## 2021-09-11 DIAGNOSIS — K5790 Diverticulosis of intestine, part unspecified, without perforation or abscess without bleeding: Secondary | ICD-10-CM | POA: Diagnosis not present

## 2021-09-11 DIAGNOSIS — I129 Hypertensive chronic kidney disease with stage 1 through stage 4 chronic kidney disease, or unspecified chronic kidney disease: Secondary | ICD-10-CM | POA: Diagnosis not present

## 2021-09-11 DIAGNOSIS — G9341 Metabolic encephalopathy: Secondary | ICD-10-CM | POA: Diagnosis not present

## 2021-09-11 DIAGNOSIS — Z9049 Acquired absence of other specified parts of digestive tract: Secondary | ICD-10-CM | POA: Diagnosis not present

## 2021-09-11 DIAGNOSIS — E039 Hypothyroidism, unspecified: Secondary | ICD-10-CM | POA: Diagnosis not present

## 2021-09-11 DIAGNOSIS — J42 Unspecified chronic bronchitis: Secondary | ICD-10-CM | POA: Diagnosis not present

## 2021-09-13 DIAGNOSIS — N390 Urinary tract infection, site not specified: Secondary | ICD-10-CM | POA: Diagnosis not present

## 2021-09-13 DIAGNOSIS — F0280 Dementia in other diseases classified elsewhere without behavioral disturbance: Secondary | ICD-10-CM | POA: Diagnosis not present

## 2021-09-13 DIAGNOSIS — E039 Hypothyroidism, unspecified: Secondary | ICD-10-CM | POA: Diagnosis not present

## 2021-09-13 DIAGNOSIS — I129 Hypertensive chronic kidney disease with stage 1 through stage 4 chronic kidney disease, or unspecified chronic kidney disease: Secondary | ICD-10-CM | POA: Diagnosis not present

## 2021-09-13 DIAGNOSIS — E1122 Type 2 diabetes mellitus with diabetic chronic kidney disease: Secondary | ICD-10-CM | POA: Diagnosis not present

## 2021-09-13 DIAGNOSIS — G9341 Metabolic encephalopathy: Secondary | ICD-10-CM | POA: Diagnosis not present

## 2021-09-16 DIAGNOSIS — E039 Hypothyroidism, unspecified: Secondary | ICD-10-CM | POA: Diagnosis not present

## 2021-09-16 DIAGNOSIS — E785 Hyperlipidemia, unspecified: Secondary | ICD-10-CM | POA: Diagnosis not present

## 2021-09-16 DIAGNOSIS — N184 Chronic kidney disease, stage 4 (severe): Secondary | ICD-10-CM | POA: Diagnosis not present

## 2021-09-16 DIAGNOSIS — R739 Hyperglycemia, unspecified: Secondary | ICD-10-CM | POA: Diagnosis not present

## 2021-09-16 DIAGNOSIS — C50919 Malignant neoplasm of unspecified site of unspecified female breast: Secondary | ICD-10-CM | POA: Diagnosis not present

## 2021-09-16 DIAGNOSIS — Z0000 Encounter for general adult medical examination without abnormal findings: Secondary | ICD-10-CM | POA: Diagnosis not present

## 2021-09-16 DIAGNOSIS — E1121 Type 2 diabetes mellitus with diabetic nephropathy: Secondary | ICD-10-CM | POA: Diagnosis not present

## 2021-09-16 DIAGNOSIS — F01B0 Vascular dementia, moderate, without behavioral disturbance, psychotic disturbance, mood disturbance, and anxiety: Secondary | ICD-10-CM | POA: Diagnosis not present

## 2021-09-16 DIAGNOSIS — Z79899 Other long term (current) drug therapy: Secondary | ICD-10-CM | POA: Diagnosis not present

## 2021-09-17 DIAGNOSIS — G9341 Metabolic encephalopathy: Secondary | ICD-10-CM | POA: Diagnosis not present

## 2021-09-17 DIAGNOSIS — N390 Urinary tract infection, site not specified: Secondary | ICD-10-CM | POA: Diagnosis not present

## 2021-09-17 DIAGNOSIS — F0280 Dementia in other diseases classified elsewhere without behavioral disturbance: Secondary | ICD-10-CM | POA: Diagnosis not present

## 2021-09-17 DIAGNOSIS — E1122 Type 2 diabetes mellitus with diabetic chronic kidney disease: Secondary | ICD-10-CM | POA: Diagnosis not present

## 2021-09-17 DIAGNOSIS — I129 Hypertensive chronic kidney disease with stage 1 through stage 4 chronic kidney disease, or unspecified chronic kidney disease: Secondary | ICD-10-CM | POA: Diagnosis not present

## 2021-09-17 DIAGNOSIS — E039 Hypothyroidism, unspecified: Secondary | ICD-10-CM | POA: Diagnosis not present

## 2021-09-18 DIAGNOSIS — I129 Hypertensive chronic kidney disease with stage 1 through stage 4 chronic kidney disease, or unspecified chronic kidney disease: Secondary | ICD-10-CM | POA: Diagnosis not present

## 2021-09-18 DIAGNOSIS — N390 Urinary tract infection, site not specified: Secondary | ICD-10-CM | POA: Diagnosis not present

## 2021-09-18 DIAGNOSIS — G9341 Metabolic encephalopathy: Secondary | ICD-10-CM | POA: Diagnosis not present

## 2021-09-18 DIAGNOSIS — E1122 Type 2 diabetes mellitus with diabetic chronic kidney disease: Secondary | ICD-10-CM | POA: Diagnosis not present

## 2021-09-18 DIAGNOSIS — E039 Hypothyroidism, unspecified: Secondary | ICD-10-CM | POA: Diagnosis not present

## 2021-09-18 DIAGNOSIS — F0280 Dementia in other diseases classified elsewhere without behavioral disturbance: Secondary | ICD-10-CM | POA: Diagnosis not present

## 2021-09-19 DIAGNOSIS — I129 Hypertensive chronic kidney disease with stage 1 through stage 4 chronic kidney disease, or unspecified chronic kidney disease: Secondary | ICD-10-CM | POA: Diagnosis not present

## 2021-09-19 DIAGNOSIS — N390 Urinary tract infection, site not specified: Secondary | ICD-10-CM | POA: Diagnosis not present

## 2021-09-19 DIAGNOSIS — F0280 Dementia in other diseases classified elsewhere without behavioral disturbance: Secondary | ICD-10-CM | POA: Diagnosis not present

## 2021-09-19 DIAGNOSIS — G9341 Metabolic encephalopathy: Secondary | ICD-10-CM | POA: Diagnosis not present

## 2021-09-19 DIAGNOSIS — E1122 Type 2 diabetes mellitus with diabetic chronic kidney disease: Secondary | ICD-10-CM | POA: Diagnosis not present

## 2021-09-19 DIAGNOSIS — E039 Hypothyroidism, unspecified: Secondary | ICD-10-CM | POA: Diagnosis not present

## 2021-09-20 DIAGNOSIS — G9341 Metabolic encephalopathy: Secondary | ICD-10-CM | POA: Diagnosis not present

## 2021-09-20 DIAGNOSIS — E1122 Type 2 diabetes mellitus with diabetic chronic kidney disease: Secondary | ICD-10-CM | POA: Diagnosis not present

## 2021-09-20 DIAGNOSIS — I129 Hypertensive chronic kidney disease with stage 1 through stage 4 chronic kidney disease, or unspecified chronic kidney disease: Secondary | ICD-10-CM | POA: Diagnosis not present

## 2021-09-20 DIAGNOSIS — E039 Hypothyroidism, unspecified: Secondary | ICD-10-CM | POA: Diagnosis not present

## 2021-09-20 DIAGNOSIS — F0280 Dementia in other diseases classified elsewhere without behavioral disturbance: Secondary | ICD-10-CM | POA: Diagnosis not present

## 2021-09-20 DIAGNOSIS — N390 Urinary tract infection, site not specified: Secondary | ICD-10-CM | POA: Diagnosis not present

## 2021-09-23 ENCOUNTER — Other Ambulatory Visit: Payer: Self-pay | Admitting: Oncology

## 2021-09-23 ENCOUNTER — Encounter: Payer: Self-pay | Admitting: Oncology

## 2021-09-23 ENCOUNTER — Inpatient Hospital Stay: Payer: Medicare Other | Attending: Oncology | Admitting: Oncology

## 2021-09-23 ENCOUNTER — Inpatient Hospital Stay: Payer: Medicare Other

## 2021-09-23 VITALS — BP 142/63 | HR 76 | Temp 97.6°F | Resp 18 | Ht 64.0 in | Wt 180.7 lb

## 2021-09-23 DIAGNOSIS — D649 Anemia, unspecified: Secondary | ICD-10-CM | POA: Diagnosis not present

## 2021-09-23 DIAGNOSIS — Z170 Estrogen receptor positive status [ER+]: Secondary | ICD-10-CM

## 2021-09-23 DIAGNOSIS — Z853 Personal history of malignant neoplasm of breast: Secondary | ICD-10-CM | POA: Insufficient documentation

## 2021-09-23 DIAGNOSIS — M8580 Other specified disorders of bone density and structure, unspecified site: Secondary | ICD-10-CM | POA: Insufficient documentation

## 2021-09-23 DIAGNOSIS — C50211 Malignant neoplasm of upper-inner quadrant of right female breast: Secondary | ICD-10-CM

## 2021-09-23 DIAGNOSIS — G9341 Metabolic encephalopathy: Secondary | ICD-10-CM | POA: Diagnosis not present

## 2021-09-23 DIAGNOSIS — D539 Nutritional anemia, unspecified: Secondary | ICD-10-CM

## 2021-09-23 DIAGNOSIS — Z780 Asymptomatic menopausal state: Secondary | ICD-10-CM

## 2021-09-23 DIAGNOSIS — N390 Urinary tract infection, site not specified: Secondary | ICD-10-CM | POA: Diagnosis not present

## 2021-09-23 LAB — FERRITIN: Ferritin: 295 ng/mL (ref 11–307)

## 2021-09-23 LAB — VITAMIN B12: Vitamin B-12: 381 pg/mL (ref 180–914)

## 2021-09-23 LAB — FOLATE: Folate: 18.1 ng/mL (ref >5.9)

## 2021-09-23 LAB — IRON AND TIBC
Iron: 49 ug/dL (ref 28–170)
Saturation Ratios: 16 % (ref 10.4–31.8)
TIBC: 302 ug/dL (ref 250–450)
UIBC: 253 ug/dL

## 2021-09-24 ENCOUNTER — Other Ambulatory Visit: Payer: Self-pay | Admitting: Oncology

## 2021-09-24 DIAGNOSIS — N390 Urinary tract infection, site not specified: Secondary | ICD-10-CM | POA: Diagnosis not present

## 2021-09-24 DIAGNOSIS — E1122 Type 2 diabetes mellitus with diabetic chronic kidney disease: Secondary | ICD-10-CM | POA: Diagnosis not present

## 2021-09-24 DIAGNOSIS — E039 Hypothyroidism, unspecified: Secondary | ICD-10-CM | POA: Diagnosis not present

## 2021-09-24 DIAGNOSIS — I129 Hypertensive chronic kidney disease with stage 1 through stage 4 chronic kidney disease, or unspecified chronic kidney disease: Secondary | ICD-10-CM | POA: Diagnosis not present

## 2021-09-24 DIAGNOSIS — F0280 Dementia in other diseases classified elsewhere without behavioral disturbance: Secondary | ICD-10-CM | POA: Diagnosis not present

## 2021-09-24 DIAGNOSIS — G9341 Metabolic encephalopathy: Secondary | ICD-10-CM | POA: Diagnosis not present

## 2021-09-25 DIAGNOSIS — E039 Hypothyroidism, unspecified: Secondary | ICD-10-CM | POA: Diagnosis not present

## 2021-09-25 DIAGNOSIS — I129 Hypertensive chronic kidney disease with stage 1 through stage 4 chronic kidney disease, or unspecified chronic kidney disease: Secondary | ICD-10-CM | POA: Diagnosis not present

## 2021-09-25 DIAGNOSIS — N390 Urinary tract infection, site not specified: Secondary | ICD-10-CM | POA: Diagnosis not present

## 2021-09-25 DIAGNOSIS — F0280 Dementia in other diseases classified elsewhere without behavioral disturbance: Secondary | ICD-10-CM | POA: Diagnosis not present

## 2021-09-25 DIAGNOSIS — G9341 Metabolic encephalopathy: Secondary | ICD-10-CM | POA: Diagnosis not present

## 2021-09-25 DIAGNOSIS — E1122 Type 2 diabetes mellitus with diabetic chronic kidney disease: Secondary | ICD-10-CM | POA: Diagnosis not present

## 2021-09-30 DIAGNOSIS — E1122 Type 2 diabetes mellitus with diabetic chronic kidney disease: Secondary | ICD-10-CM | POA: Diagnosis not present

## 2021-09-30 DIAGNOSIS — E039 Hypothyroidism, unspecified: Secondary | ICD-10-CM | POA: Diagnosis not present

## 2021-09-30 DIAGNOSIS — F0280 Dementia in other diseases classified elsewhere without behavioral disturbance: Secondary | ICD-10-CM | POA: Diagnosis not present

## 2021-09-30 DIAGNOSIS — N390 Urinary tract infection, site not specified: Secondary | ICD-10-CM | POA: Diagnosis not present

## 2021-09-30 DIAGNOSIS — G9341 Metabolic encephalopathy: Secondary | ICD-10-CM | POA: Diagnosis not present

## 2021-09-30 DIAGNOSIS — I129 Hypertensive chronic kidney disease with stage 1 through stage 4 chronic kidney disease, or unspecified chronic kidney disease: Secondary | ICD-10-CM | POA: Diagnosis not present

## 2021-09-30 LAB — VITAMIN D 1,25 DIHYDROXY
Vitamin D 1, 25 (OH)2 Total: 20 pg/mL — ABNORMAL LOW
Vitamin D2 1, 25 (OH)2: <10 pg/mL
Vitamin D3 1, 25 (OH)2: 20 pg/mL

## 2021-10-08 DIAGNOSIS — E039 Hypothyroidism, unspecified: Secondary | ICD-10-CM | POA: Diagnosis not present

## 2021-10-08 DIAGNOSIS — E1122 Type 2 diabetes mellitus with diabetic chronic kidney disease: Secondary | ICD-10-CM | POA: Diagnosis not present

## 2021-10-08 DIAGNOSIS — F0280 Dementia in other diseases classified elsewhere without behavioral disturbance: Secondary | ICD-10-CM | POA: Diagnosis not present

## 2021-10-08 DIAGNOSIS — N390 Urinary tract infection, site not specified: Secondary | ICD-10-CM | POA: Diagnosis not present

## 2021-10-08 DIAGNOSIS — G9341 Metabolic encephalopathy: Secondary | ICD-10-CM | POA: Diagnosis not present

## 2021-10-08 DIAGNOSIS — I129 Hypertensive chronic kidney disease with stage 1 through stage 4 chronic kidney disease, or unspecified chronic kidney disease: Secondary | ICD-10-CM | POA: Diagnosis not present

## 2021-10-09 DIAGNOSIS — C50211 Malignant neoplasm of upper-inner quadrant of right female breast: Secondary | ICD-10-CM | POA: Diagnosis not present

## 2021-10-09 DIAGNOSIS — R928 Other abnormal and inconclusive findings on diagnostic imaging of breast: Secondary | ICD-10-CM | POA: Diagnosis not present

## 2021-10-09 DIAGNOSIS — N6310 Unspecified lump in the right breast, unspecified quadrant: Secondary | ICD-10-CM | POA: Diagnosis not present

## 2021-10-11 DIAGNOSIS — Z9181 History of falling: Secondary | ICD-10-CM | POA: Diagnosis not present

## 2021-10-11 DIAGNOSIS — Z87442 Personal history of urinary calculi: Secondary | ICD-10-CM | POA: Diagnosis not present

## 2021-10-11 DIAGNOSIS — N179 Acute kidney failure, unspecified: Secondary | ICD-10-CM | POA: Diagnosis not present

## 2021-10-11 DIAGNOSIS — E1122 Type 2 diabetes mellitus with diabetic chronic kidney disease: Secondary | ICD-10-CM | POA: Diagnosis not present

## 2021-10-11 DIAGNOSIS — E039 Hypothyroidism, unspecified: Secondary | ICD-10-CM | POA: Diagnosis not present

## 2021-10-11 DIAGNOSIS — Z90710 Acquired absence of both cervix and uterus: Secondary | ICD-10-CM | POA: Diagnosis not present

## 2021-10-11 DIAGNOSIS — I2510 Atherosclerotic heart disease of native coronary artery without angina pectoris: Secondary | ICD-10-CM | POA: Diagnosis not present

## 2021-10-11 DIAGNOSIS — Z8542 Personal history of malignant neoplasm of other parts of uterus: Secondary | ICD-10-CM | POA: Diagnosis not present

## 2021-10-11 DIAGNOSIS — N390 Urinary tract infection, site not specified: Secondary | ICD-10-CM | POA: Diagnosis not present

## 2021-10-11 DIAGNOSIS — Z792 Long term (current) use of antibiotics: Secondary | ICD-10-CM | POA: Diagnosis not present

## 2021-10-11 DIAGNOSIS — G9341 Metabolic encephalopathy: Secondary | ICD-10-CM | POA: Diagnosis not present

## 2021-10-11 DIAGNOSIS — R569 Unspecified convulsions: Secondary | ICD-10-CM | POA: Diagnosis not present

## 2021-10-11 DIAGNOSIS — Z853 Personal history of malignant neoplasm of breast: Secondary | ICD-10-CM | POA: Diagnosis not present

## 2021-10-11 DIAGNOSIS — E7800 Pure hypercholesterolemia, unspecified: Secondary | ICD-10-CM | POA: Diagnosis not present

## 2021-10-11 DIAGNOSIS — I700 Atherosclerosis of aorta: Secondary | ICD-10-CM | POA: Diagnosis not present

## 2021-10-11 DIAGNOSIS — E1165 Type 2 diabetes mellitus with hyperglycemia: Secondary | ICD-10-CM | POA: Diagnosis not present

## 2021-10-11 DIAGNOSIS — F0280 Dementia in other diseases classified elsewhere without behavioral disturbance: Secondary | ICD-10-CM | POA: Diagnosis not present

## 2021-10-11 DIAGNOSIS — K5900 Constipation, unspecified: Secondary | ICD-10-CM | POA: Diagnosis not present

## 2021-10-11 DIAGNOSIS — K5790 Diverticulosis of intestine, part unspecified, without perforation or abscess without bleeding: Secondary | ICD-10-CM | POA: Diagnosis not present

## 2021-10-11 DIAGNOSIS — I129 Hypertensive chronic kidney disease with stage 1 through stage 4 chronic kidney disease, or unspecified chronic kidney disease: Secondary | ICD-10-CM | POA: Diagnosis not present

## 2021-10-11 DIAGNOSIS — Z9049 Acquired absence of other specified parts of digestive tract: Secondary | ICD-10-CM | POA: Diagnosis not present

## 2021-10-11 DIAGNOSIS — J42 Unspecified chronic bronchitis: Secondary | ICD-10-CM | POA: Diagnosis not present

## 2021-10-11 DIAGNOSIS — N183 Chronic kidney disease, stage 3 unspecified: Secondary | ICD-10-CM | POA: Diagnosis not present

## 2021-10-13 DIAGNOSIS — R1031 Right lower quadrant pain: Secondary | ICD-10-CM | POA: Diagnosis not present

## 2021-10-13 DIAGNOSIS — K5730 Diverticulosis of large intestine without perforation or abscess without bleeding: Secondary | ICD-10-CM | POA: Diagnosis not present

## 2021-10-13 DIAGNOSIS — N281 Cyst of kidney, acquired: Secondary | ICD-10-CM | POA: Diagnosis not present

## 2021-10-13 DIAGNOSIS — M545 Low back pain, unspecified: Secondary | ICD-10-CM | POA: Diagnosis not present

## 2021-10-13 DIAGNOSIS — K449 Diaphragmatic hernia without obstruction or gangrene: Secondary | ICD-10-CM | POA: Diagnosis not present

## 2021-10-13 DIAGNOSIS — N390 Urinary tract infection, site not specified: Secondary | ICD-10-CM | POA: Diagnosis not present

## 2021-10-13 DIAGNOSIS — M79621 Pain in right upper arm: Secondary | ICD-10-CM | POA: Diagnosis not present

## 2021-10-14 DIAGNOSIS — M8580 Other specified disorders of bone density and structure, unspecified site: Secondary | ICD-10-CM | POA: Insufficient documentation

## 2021-10-14 DIAGNOSIS — Z780 Asymptomatic menopausal state: Secondary | ICD-10-CM | POA: Insufficient documentation

## 2021-10-14 NOTE — Progress Notes (Signed)
DATE: 09/23/21  Established Patient Office Visit  Subjective   Patient ID: Kathy Ho, female    DOB: [birth year]  Age: 81 y.o. MRN: [id number]  Chief Complaint  Patient presents with   Follow-up  Stage IIA breast cancer  HPI This is an 81 year old woman with a history of a right breast cancer stage IIA diagnosed in April of 2018.  This was a grade 2 invasive ductal carcinoma with positive estrogen and progesterone receptors, negative HER 2 and Ki67 of 7%. She was treated with lumpectomy and the mass was 2.4 cm with 2 negative sentinel nodes for a T2 N0 M0. Endopredict score was 2.5, which is low risk for recurrence, approximately 4.6% risk of distant recurrence in the next 10 years.  Five years ago she was noted to have a postop seroma over 2 cm. She also has osteopenia, and has been on Prolia through her PCP. She was placed on anastrazole in June of 2018 but was discontinued the following year due to memory difficulties. A CT of the brain revealed lacunar infarctions of the basal ganglia and thalami and mild chronic small vessel white matter disease. I did try her on letrozole in December of 2019 but she developed a rash and this was stopped in February.  INTERVAL HISTORY She was on yearly follow up but I last saw her in May of 2021, so we missed last year. Dr. Noberto Retort did her mammogram in August of 2022 and she had a persistent tiny 4 mm mass at the 3 o'clock position but it seems less apparent than on previous studies.She now comes in for follow up and was recently hospitalized for hypercalcemia, UTI a nd acute metabolic encephalopathy. She had acute on chronic kidney disease stage 3, with her creatinine going from 2.1 to 1.7 during her stay. They requested she see me in case the hypercalcemia was caused by her malignancy. The calcium level was running 11-11.4 with normal PTH and normal vitamin D. She does drink a lot of milk and I recommended she decrease this.She was also anemic,  and had impacted stools, nausea and vomiting so I think some of this was dehydration. Her appetite is poorand her weight is down 11 pounds since I saw her 2 years ago. Dr. Noberto Retort will be seeing her in a few weeks with bilateral mammogram.   Review of Systems  Constitutional:  Positive for malaise/fatigue and weight loss.  HENT: Negative.    Eyes: Negative.   Respiratory: Negative.    Cardiovascular: Negative.   Gastrointestinal:  Positive for nausea.  Genitourinary: Negative.   Musculoskeletal: Negative.   Skin: Negative.   Neurological:  Positive for weakness.       She is in a wheelchair with generalized weakness and seems dazed and confused.  Endo/Heme/Allergies: Negative.   Psychiatric/Behavioral:  Positive for memory loss.      Objective:     BP (!) 142/63 (BP Location: Right Arm, Patient Position: Sitting)   Pulse 76   Temp 97.6 F (36.4 C) (Oral)   Resp 18   Ht 5' 4"  (1.626 m)   Wt 180 lb 11.2 oz (82 kg)   SpO2 99%   BMI 31.02 kg/m    Physical Exam Vitals reviewed.  Constitutional:      Appearance: She is ill-appearing.     Comments: She is weak and in a wheelchair, looking dazed and confused, slightly lethargic  HENT:     Head: Normocephalic and atraumatic.  Nose: Nose normal.  Eyes:     Extraocular Movements: Extraocular movements intact.     Conjunctiva/sclera: Conjunctivae normal.  Cardiovascular:     Rate and Rhythm: Normal rate and regular rhythm.     Heart sounds: Normal heart sounds.  Pulmonary:     Breath sounds: Normal breath sounds.  Abdominal:     Palpations: Abdomen is soft.  Musculoskeletal:        General: Normal range of motion.     Cervical back: Normal range of motion.  Skin:    General: Skin is warm and dry.  Neurological:     Mental Status: She is disoriented.     Motor: Weakness present.    Results for orders placed or performed in visit on 09/23/21  Vitamin D 1,25 dihydroxy  Result Value Ref Range   Vitamin D 1, 25  (OH)2 Total 20 (L) pg/mL   Vitamin D3 1, 25 (OH)2 20 pg/mL   Vitamin D2 1, 25 (OH)2 <10 pg/mL  Vitamin B12  Result Value Ref Range   Vitamin B-12 381 180 - 914 pg/mL  Folate  Result Value Ref Range   Folate 18.1 >5.9 ng/mL  Ferritin  Result Value Ref Range   Ferritin 295 11 - 307 ng/mL  Iron and TIBC  Result Value Ref Range   Iron 49 28 - 170 ug/dL   TIBC 302 250 - 450 ug/dL   Saturation Ratios 16 10.4 - 31.8 %   UIBC 253 ug/dL       Assessment & Plan:   Stage IIA breast cancer This was T2 N0 M0 right breast and treated with surgery but did not tolerate hormonal therapy. She has no evidence of disease.  Hypercalcemia This was mild and not likely related to malignancy. I will check vitamin D levels and repeat her calcium.  I recommend she decrease her milk intake.  Osteopenia She was on Prolia injections through Dr.Street.  Anemia I will check B12, folate and iron studies.   I will check some additional labs today and call with those results.  Dr. Noberto Retort will be getting a bilateral annual mammogram in a few weeks. I told her to decrease her intake of milk and dairy.I can see her back in 6 months with CBC and CMP. They understand and agree with this plan of care. Their questions were answered and I would be glad to see her sooner if problems arise related to her breast cancer.  I provided 30 minutes for this appointment and over 50% was spent in face to face discussion.    Problem List Items Addressed This Visit       Medium    Hypercalcemia     Unprioritized   Malignant neoplasm of upper-inner quadrant of right female breast ([place]) - Primary (Chronic)    Return in about 6 months (around 03/26/2022).    Derwood Kaplan, MD

## 2021-10-15 DIAGNOSIS — C50211 Malignant neoplasm of upper-inner quadrant of right female breast: Secondary | ICD-10-CM | POA: Diagnosis not present

## 2021-10-15 DIAGNOSIS — Z170 Estrogen receptor positive status [ER+]: Secondary | ICD-10-CM | POA: Diagnosis not present

## 2021-10-16 DIAGNOSIS — I129 Hypertensive chronic kidney disease with stage 1 through stage 4 chronic kidney disease, or unspecified chronic kidney disease: Secondary | ICD-10-CM | POA: Diagnosis not present

## 2021-10-16 DIAGNOSIS — E1122 Type 2 diabetes mellitus with diabetic chronic kidney disease: Secondary | ICD-10-CM | POA: Diagnosis not present

## 2021-10-16 DIAGNOSIS — N390 Urinary tract infection, site not specified: Secondary | ICD-10-CM | POA: Diagnosis not present

## 2021-10-16 DIAGNOSIS — E039 Hypothyroidism, unspecified: Secondary | ICD-10-CM | POA: Diagnosis not present

## 2021-10-16 DIAGNOSIS — F0280 Dementia in other diseases classified elsewhere without behavioral disturbance: Secondary | ICD-10-CM | POA: Diagnosis not present

## 2021-10-16 DIAGNOSIS — G9341 Metabolic encephalopathy: Secondary | ICD-10-CM | POA: Diagnosis not present

## 2021-10-17 DIAGNOSIS — N184 Chronic kidney disease, stage 4 (severe): Secondary | ICD-10-CM | POA: Diagnosis not present

## 2021-10-17 DIAGNOSIS — Z6830 Body mass index (BMI) 30.0-30.9, adult: Secondary | ICD-10-CM | POA: Diagnosis not present

## 2021-10-17 DIAGNOSIS — M545 Low back pain, unspecified: Secondary | ICD-10-CM | POA: Diagnosis not present

## 2021-10-24 DIAGNOSIS — F0280 Dementia in other diseases classified elsewhere without behavioral disturbance: Secondary | ICD-10-CM | POA: Diagnosis not present

## 2021-10-24 DIAGNOSIS — E1122 Type 2 diabetes mellitus with diabetic chronic kidney disease: Secondary | ICD-10-CM | POA: Diagnosis not present

## 2021-10-24 DIAGNOSIS — I129 Hypertensive chronic kidney disease with stage 1 through stage 4 chronic kidney disease, or unspecified chronic kidney disease: Secondary | ICD-10-CM | POA: Diagnosis not present

## 2021-10-24 DIAGNOSIS — E039 Hypothyroidism, unspecified: Secondary | ICD-10-CM | POA: Diagnosis not present

## 2021-10-24 DIAGNOSIS — G9341 Metabolic encephalopathy: Secondary | ICD-10-CM | POA: Diagnosis not present

## 2021-10-24 DIAGNOSIS — N390 Urinary tract infection, site not specified: Secondary | ICD-10-CM | POA: Diagnosis not present

## 2021-10-30 DIAGNOSIS — E039 Hypothyroidism, unspecified: Secondary | ICD-10-CM | POA: Diagnosis not present

## 2021-10-30 DIAGNOSIS — E1122 Type 2 diabetes mellitus with diabetic chronic kidney disease: Secondary | ICD-10-CM | POA: Diagnosis not present

## 2021-10-30 DIAGNOSIS — I129 Hypertensive chronic kidney disease with stage 1 through stage 4 chronic kidney disease, or unspecified chronic kidney disease: Secondary | ICD-10-CM | POA: Diagnosis not present

## 2021-10-30 DIAGNOSIS — N390 Urinary tract infection, site not specified: Secondary | ICD-10-CM | POA: Diagnosis not present

## 2021-10-30 DIAGNOSIS — G9341 Metabolic encephalopathy: Secondary | ICD-10-CM | POA: Diagnosis not present

## 2021-10-30 DIAGNOSIS — F0280 Dementia in other diseases classified elsewhere without behavioral disturbance: Secondary | ICD-10-CM | POA: Diagnosis not present

## 2021-11-08 DIAGNOSIS — F0280 Dementia in other diseases classified elsewhere without behavioral disturbance: Secondary | ICD-10-CM | POA: Diagnosis not present

## 2021-11-08 DIAGNOSIS — N390 Urinary tract infection, site not specified: Secondary | ICD-10-CM | POA: Diagnosis not present

## 2021-11-08 DIAGNOSIS — E039 Hypothyroidism, unspecified: Secondary | ICD-10-CM | POA: Diagnosis not present

## 2021-11-08 DIAGNOSIS — I129 Hypertensive chronic kidney disease with stage 1 through stage 4 chronic kidney disease, or unspecified chronic kidney disease: Secondary | ICD-10-CM | POA: Diagnosis not present

## 2021-11-08 DIAGNOSIS — G9341 Metabolic encephalopathy: Secondary | ICD-10-CM | POA: Diagnosis not present

## 2021-11-08 DIAGNOSIS — E1122 Type 2 diabetes mellitus with diabetic chronic kidney disease: Secondary | ICD-10-CM | POA: Diagnosis not present

## 2022-01-17 DIAGNOSIS — R5381 Other malaise: Secondary | ICD-10-CM | POA: Diagnosis not present

## 2022-01-17 DIAGNOSIS — Z6830 Body mass index (BMI) 30.0-30.9, adult: Secondary | ICD-10-CM | POA: Diagnosis not present

## 2022-01-17 DIAGNOSIS — R5383 Other fatigue: Secondary | ICD-10-CM | POA: Diagnosis not present

## 2022-01-17 DIAGNOSIS — F01B0 Vascular dementia, moderate, without behavioral disturbance, psychotic disturbance, mood disturbance, and anxiety: Secondary | ICD-10-CM | POA: Diagnosis not present

## 2022-01-20 DIAGNOSIS — I10 Essential (primary) hypertension: Secondary | ICD-10-CM | POA: Diagnosis not present

## 2022-01-23 DIAGNOSIS — I672 Cerebral atherosclerosis: Secondary | ICD-10-CM | POA: Diagnosis not present

## 2022-01-23 DIAGNOSIS — G720 Drug-induced myopathy: Secondary | ICD-10-CM | POA: Diagnosis not present

## 2022-01-23 DIAGNOSIS — E538 Deficiency of other specified B group vitamins: Secondary | ICD-10-CM | POA: Diagnosis not present

## 2022-01-23 DIAGNOSIS — I10 Essential (primary) hypertension: Secondary | ICD-10-CM | POA: Diagnosis not present

## 2022-01-24 DIAGNOSIS — E538 Deficiency of other specified B group vitamins: Secondary | ICD-10-CM | POA: Diagnosis not present

## 2022-01-31 DIAGNOSIS — D519 Vitamin B12 deficiency anemia, unspecified: Secondary | ICD-10-CM | POA: Diagnosis not present

## 2022-02-18 DIAGNOSIS — D519 Vitamin B12 deficiency anemia, unspecified: Secondary | ICD-10-CM | POA: Diagnosis not present

## 2022-02-28 DIAGNOSIS — D519 Vitamin B12 deficiency anemia, unspecified: Secondary | ICD-10-CM | POA: Diagnosis not present

## 2022-03-05 DIAGNOSIS — F0390 Unspecified dementia without behavioral disturbance: Secondary | ICD-10-CM | POA: Diagnosis not present

## 2022-03-05 DIAGNOSIS — N3000 Acute cystitis without hematuria: Secondary | ICD-10-CM | POA: Diagnosis not present

## 2022-03-05 DIAGNOSIS — N179 Acute kidney failure, unspecified: Secondary | ICD-10-CM | POA: Diagnosis not present

## 2022-03-05 DIAGNOSIS — E119 Type 2 diabetes mellitus without complications: Secondary | ICD-10-CM | POA: Diagnosis not present

## 2022-03-05 DIAGNOSIS — N390 Urinary tract infection, site not specified: Secondary | ICD-10-CM | POA: Diagnosis not present

## 2022-03-14 DIAGNOSIS — D519 Vitamin B12 deficiency anemia, unspecified: Secondary | ICD-10-CM | POA: Diagnosis not present

## 2022-03-20 DIAGNOSIS — I10 Essential (primary) hypertension: Secondary | ICD-10-CM | POA: Diagnosis not present

## 2022-03-20 DIAGNOSIS — N390 Urinary tract infection, site not specified: Secondary | ICD-10-CM | POA: Diagnosis not present

## 2022-03-20 DIAGNOSIS — M810 Age-related osteoporosis without current pathological fracture: Secondary | ICD-10-CM | POA: Diagnosis not present

## 2022-03-20 DIAGNOSIS — R053 Chronic cough: Secondary | ICD-10-CM | POA: Diagnosis not present

## 2022-03-21 DIAGNOSIS — E538 Deficiency of other specified B group vitamins: Secondary | ICD-10-CM | POA: Diagnosis not present

## 2022-03-24 NOTE — Progress Notes (Incomplete)
Patient Care Team: Street, Sharon Mt, MD- Primary Caregiver Derwood Kaplan, MD as PCP - Hematology/Oncology (Oncology)  Clinic Day:  03/26/22    Referring physician:  Street, Sharon Mt, MD     Subjective   Patient ID: Kathy Ho, female    DOB: [birth year]  Age: 82 y.o. MRN: [id number]   CHIEF COMPLAINT:  CC: Stage IB breast cancer  Current Treatment:   HISTORY OF PRESENT ILLNESS:  This is an 82 year old woman with a history of a right breast cancer stage IIA diagnosed in April of 2018.  This was a grade 2 invasive ductal carcinoma with positive estrogen and progesterone receptors, negative HER 2 and Ki67 of 7%. She was treated with lumpectomy and the mass was 2.4 cm with 2 negative sentinel nodes for a T2 N0 M0. Endopredict score was 2.5, which is low risk for recurrence, approximately 4.6% risk of distant recurrence in the next 10 years.  Five years ago she was noted to have a postop seroma over 2 cm. She also has osteopenia, and has been on Prolia through her PCP. She was placed on anastrazole in June of 2018 but was discontinued the following year due to memory difficulties. A CT of the brain revealed lacunar infarctions of the basal ganglia and thalami and mild chronic small vessel white matter disease. I did try her on letrozole in December of 2019 but she developed a rash and this was stopped in February.   INTERVAL HISTORY:         She was on yearly follow up but I last saw her in May of 2021, so we missed last year. Dr. Noberto Retort did her mammogram in August of 2022 and she had a persistent tiny 4 mm mass at the 3 o'clock position but it seems less apparent than on previous studies.  She now comes in for follow up and was recently hospitalized for hypercalcemia, UTI a nd acute metabolic encephalopathy.   She had acute on chronic kidney disease stage 3, with her creatinine going from 2.1 to 1.7 during her stay.   They requested she see me in  case the hypercalcemia was caused by her malignancy. The calcium level was running 11-11.4 with normal PTH and normal vitamin D. She does drink a lot of milk and I recommended she decrease this.  She was also anemic, and had impacted stools, nausea and vomiting so I think some of this was dehydration. Her appetite is poorand her weight is down 11 pounds since I saw her 2 years ago. Dr. Noberto Retort will be seeing her in a few weeks with bilateral mammogram.   REVIEW OF SYMPTOMS:  Review of Systems  Constitutional: Negative.  Negative for appetite change, chills, diaphoresis, fatigue, fever and unexpected weight change.  HENT:  Negative.  Negative for hearing loss, lump/mass, mouth sores, nosebleeds, sore throat, tinnitus, trouble swallowing and voice change.   Eyes: Negative.  Negative for eye problems and icterus.  Respiratory: Negative.  Negative for chest tightness, cough, hemoptysis, shortness of breath and wheezing.   Cardiovascular: Negative.  Negative for chest pain, leg swelling and palpitations.  Gastrointestinal:  Positive for nausea. Negative for abdominal distention, abdominal pain, blood in stool, constipation, diarrhea, rectal pain and vomiting.  Endocrine: Negative.  Negative for hot flashes.  Genitourinary: Negative.  Negative for bladder incontinence, difficulty urinating, dyspareunia, dysuria, frequency, hematuria, menstrual problem, nocturia, pelvic pain, vaginal bleeding and vaginal discharge.   Musculoskeletal:  Negative for arthralgias, back pain, flank  pain, gait problem, myalgias, neck pain and neck stiffness.  Skin: Negative.  Negative for itching, rash and wound.  Neurological:  Positive for extremity weakness. Negative for dizziness, gait problem, headaches, light-headedness, numbness, seizures and speech difficulty.       She is in a wheelchair with generalized weakness and seems dazed and confused.  Hematological: Negative.  Negative for adenopathy. Does not bruise/bleed  easily.  Psychiatric/Behavioral: Negative.  Negative for confusion, decreased concentration, depression, sleep disturbance and suicidal ideas. The patient is not nervous/anxious.    Memory loss???   VITALS:     There were no vitals taken for this visit.    PHYSICAL EXAM:  Physical Exam Vitals reviewed.  Constitutional:      General: She is not in acute distress.    Appearance: Normal appearance. She is normal weight. She is ill-appearing. She is not toxic-appearing or diaphoretic.     Comments: She is weak and in a wheelchair, looking dazed and confused, slightly lethargic  HENT:     Head: Normocephalic and atraumatic.     Right Ear: Tympanic membrane, ear canal and external ear normal. There is no impacted cerumen.     Left Ear: Tympanic membrane, ear canal and external ear normal. There is no impacted cerumen.     Nose: Nose normal. No congestion or rhinorrhea.     Mouth/Throat:     Mouth: Mucous membranes are moist.     Pharynx: Oropharynx is clear. No oropharyngeal exudate or posterior oropharyngeal erythema.  Eyes:     General: No scleral icterus.       Right eye: No discharge.        Left eye: No discharge.     Extraocular Movements: Extraocular movements intact.     Conjunctiva/sclera: Conjunctivae normal.     Pupils: Pupils are equal, round, and reactive to light.  Neck:     Vascular: No carotid bruit.  Cardiovascular:     Rate and Rhythm: Normal rate and regular rhythm.     Pulses: Normal pulses.     Heart sounds: Normal heart sounds. No murmur heard.    No friction rub. No gallop.  Pulmonary:     Effort: Pulmonary effort is normal. No respiratory distress.     Breath sounds: Normal breath sounds. No stridor. No wheezing, rhonchi or rales.  Chest:     Chest wall: No tenderness.  Abdominal:     General: Bowel sounds are normal. There is no distension.     Palpations: Abdomen is soft. There is no mass.     Tenderness: There is no abdominal tenderness. There is  no right CVA tenderness, left CVA tenderness, guarding or rebound.     Hernia: No hernia is present.  Musculoskeletal:        General: No swelling, tenderness, deformity or signs of injury. Normal range of motion.     Cervical back: Normal range of motion and neck supple. No rigidity or tenderness.     Right lower leg: No edema.     Left lower leg: No edema.  Lymphadenopathy:     Cervical: No cervical adenopathy.  Skin:    General: Skin is warm and dry.     Coloration: Skin is not jaundiced or pale.     Findings: No bruising, erythema, lesion or rash.  Neurological:     General: No focal deficit present.     Mental Status: She is alert and oriented to person, place, and time. Mental status is at baseline.  Cranial Nerves: No cranial nerve deficit.     Sensory: No sensory deficit.     Motor: Weakness present.     Coordination: Coordination normal.     Gait: Gait normal.     Deep Tendon Reflexes: Reflexes normal.  Psychiatric:        Mood and Affect: Mood normal.        Behavior: Behavior normal.        Thought Content: Thought content normal.        Judgment: Judgment normal.    Assessment & Plan:   Stage IIA breast cancer This was T2 N0 M0 right breast and treated with surgery but did not tolerate hormonal therapy. She has no evidence of disease.  Hypercalcemia This was mild and not likely related to malignancy. I will check vitamin D levels and repeat her calcium.  I recommend she decrease her milk intake.  Osteopenia She was on Prolia injections through Dr.Street.  Anemia I will check B12, folate and iron studies.  Plan I will check some additional labs today and call with those results.  Dr. Noberto Retort will be getting a bilateral annual mammogram in a few weeks. I told her to decrease her intake of milk and dairy.I can see her back in 6 months with CBC and CMP. They understand and agree with this plan of care. Their questions were answered and I would be glad to see  her sooner if problems arise related to her breast cancer.  I provided 30 minutes for this appointment and over 50% was spent in face to face discussion.   Derwood Kaplan, MD [facility] [address] [place] [address] Dept: [phone] Dept Fax: [phone]   Sumner Boast Lassiter,acting as a scribe for Derwood Kaplan, MD.,have documented all relevant documentation on the behalf of Derwood Kaplan, MD,as directed by  Derwood Kaplan, MD while in the presence of Derwood Kaplan, MD.

## 2022-03-26 ENCOUNTER — Ambulatory Visit: Payer: Medicare Other | Admitting: Oncology

## 2022-03-26 ENCOUNTER — Other Ambulatory Visit: Payer: Medicare Other

## 2022-03-28 DIAGNOSIS — D510 Vitamin B12 deficiency anemia due to intrinsic factor deficiency: Secondary | ICD-10-CM | POA: Diagnosis not present

## 2022-04-16 NOTE — Progress Notes (Signed)
[facility]  [address] [place],  [place]  [address] [phone]  Clinic Day:  04/18/22  Referring physician:   ASSESSMENT & PLAN:  Assessment & Plan: Stage IIA breast cancer This was a T2 N0 M0 right breast carcinoma, and treated with surgery but did not tolerate hormonal therapy. She has no evidence of disease.  Hypercalcemia This was mild and not likely related to malignancy. I will check vitamin D levels and repeat her calcium.  I recommend she decrease her milk intake.  Osteopenia She was on Prolia injections through Dr.Street.   Plan Her annual mammogram and ultrasound in August, 2023 was clear. Her labs today are pending. I will see her back in a year with CBC and CMP. They understand and agree with this plan of care. Their questions were answered and I would be glad to see her sooner if problems arise related to her breast cancer.  I provided 15 minutes for this appointment and over 50% was spent in face to face discussion.  [facility] [address] [place] [address] Dept: [phone] Dept Fax: [phone]   CHIEF COMPLAIN:   CC: Stage IIA breast cancer  Current Treatment:  HISTORY OF PRESENT ILLNESS:  This is an 82 year old woman with a history of a right breast cancer stage IIA diagnosed in April of 2018.  This was a grade 2 invasive ductal carcinoma with positive estrogen and progesterone receptors, negative HER 2 and Ki67 of 7%. She was treated with lumpectomy and the mass was 2.4 cm with 2 negative sentinel nodes for a T2 N0 M0. Endopredict score was 2.5, which is low risk for recurrence, approximately 4.6% risk of distant recurrence in the next 10 years.  Five years ago she was noted to have a postop seroma over 2 cm. She also has osteopenia, and has been on Prolia through her PCP. She was placed on anastrazole in June of 2018 but was discontinued the  following year due to memory difficulties. A CT of the brain revealed lacunar infarctions of the basal ganglia and thalami and mild chronic small vessel white matter disease. I did try her on letrozole in December of 2019 but she developed a rash and this was stopped in February.  INTERVAL HISTORY:  Sonam is here today for a follow-up visit for her stage IIA breast cancer. Patient states that she feels ok and has no complaints. Her annual mammogram and ultrasound in August, 2023  was clear. Her labs today are pending. I will see her back in a year with CBC and CMP.  She denies signs of infection such as sore throat, sinus drainage, cough, or urinary symptoms.  She denies fevers or recurrent chills. She denies pain. She denies nausea, vomiting, chest pain, dyspnea or cough. Her weight has increased 7 pounds over last 7 months . She is accompanied at today's visit with her husband.   REVIEW OF SYMPTOMS:  Review of Systems  Constitutional:  Positive for fatigue. Negative for appetite change, chills, diaphoresis, fever and unexpected weight change.  HENT:  Negative.  Negative for hearing loss, lump/mass, mouth sores, nosebleeds, sore throat, tinnitus, trouble swallowing and voice change.   Eyes: Negative.  Negative for eye problems and icterus.  Respiratory: Negative.  Negative for chest tightness, cough, hemoptysis, shortness of breath and wheezing.   Cardiovascular: Negative.  Negative for chest pain, leg swelling and palpitations.  Gastrointestinal: Negative.  Negative  for abdominal distention, abdominal pain, blood in stool, constipation, diarrhea, nausea, rectal pain and vomiting.  Endocrine: Negative.   Genitourinary: Negative.  Negative for bladder incontinence, difficulty urinating, dyspareunia, dysuria, frequency, hematuria, menstrual problem, nocturia, pelvic pain, vaginal bleeding and vaginal discharge.   Musculoskeletal:  Negative for arthralgias, back pain, flank pain, gait problem,  myalgias, neck pain and neck stiffness.  Skin: Negative.  Negative for itching, rash and wound.  Neurological:  Positive for extremity weakness (generalized). Negative for dizziness, gait problem, headaches, light-headedness, numbness, seizures and speech difficulty.       She is using a cane with generalized weakness and seems dazed and confused.  Hematological: Negative.  Negative for adenopathy. Does not bruise/bleed easily.  Psychiatric/Behavioral:  Positive for confusion. Negative for decreased concentration, depression, sleep disturbance and suicidal ideas. The patient is not nervous/anxious.        Memory loss   VITALS:   BSA: 1.96 m Wt: 187 lb 6.4 oz (85 kg) Ht: 5\' 4"  (1.626 m) BP: 134/57 BMI: 32.17 kg/m  PHYSICAL EXAM:  Physical Exam Vitals and nursing note reviewed. Exam conducted with a chaperone present.  Constitutional:      General: She is not in acute distress.    Appearance: Normal appearance. She is normal weight. She is not ill-appearing, toxic-appearing or diaphoretic.  HENT:     Head: Normocephalic and atraumatic.     Right Ear: Tympanic membrane, ear canal and external ear normal. There is no impacted cerumen.     Left Ear: Tympanic membrane, ear canal and external ear normal. There is no impacted cerumen.     Nose: Nose normal. No congestion or rhinorrhea.     Mouth/Throat:     Mouth: Mucous membranes are moist.     Pharynx: Oropharynx is clear. No oropharyngeal exudate or posterior oropharyngeal erythema.  Eyes:     General: No scleral icterus.       Right eye: No discharge.        Left eye: No discharge.     Extraocular Movements: Extraocular movements intact.     Conjunctiva/sclera: Conjunctivae normal.     Pupils: Pupils are equal, round, and reactive to light.  Neck:     Vascular: No carotid bruit.  Cardiovascular:     Rate and Rhythm: Normal rate and regular rhythm.     Pulses: Normal pulses.     Heart sounds: Normal heart sounds. No murmur  heard.    No friction rub. No gallop.  Pulmonary:     Effort: Pulmonary effort is normal. No respiratory distress.     Breath sounds: Normal breath sounds. No stridor. No wheezing, rhonchi or rales.  Chest:     Chest wall: No tenderness.  Breasts:    Right: Normal.     Left: Normal.     Comments: Inversion of the right nipple. No masses in either breast.  Abdominal:     General: Bowel sounds are normal. There is no distension.     Palpations: Abdomen is soft. There is no hepatomegaly, splenomegaly or mass.     Tenderness: There is no abdominal tenderness. There is no right CVA tenderness, left CVA tenderness, guarding or rebound.     Hernia: No hernia is present.  Musculoskeletal:        General: No swelling, tenderness, deformity or signs of injury. Normal range of motion.     Cervical back: Normal range of motion and neck supple. No rigidity or tenderness.     Right lower  leg: No edema.     Left lower leg: No edema.  Lymphadenopathy:     Cervical: No cervical adenopathy.     Right cervical: No superficial, deep or posterior cervical adenopathy.    Left cervical: No superficial, deep or posterior cervical adenopathy.     Upper Body:     Right upper body: No supraclavicular, axillary or pectoral adenopathy.     Left upper body: No supraclavicular, axillary or pectoral adenopathy.  Skin:    General: Skin is warm and dry.     Coloration: Skin is not jaundiced or pale.     Findings: No bruising, erythema, lesion or rash.  Neurological:     General: No focal deficit present.     Mental Status: She is alert. Mental status is at baseline. She is disoriented.     Cranial Nerves: No cranial nerve deficit.     Sensory: No sensory deficit.     Motor: Weakness present.     Coordination: Coordination normal.     Gait: Gait normal.     Deep Tendon Reflexes: Reflexes normal.  Psychiatric:        Mood and Affect: Mood normal.        Behavior: Behavior normal.        Thought Content:  Thought content normal.        Judgment: Judgment normal.    HISTORY:   Past Medical History:  Diagnosis Date   Diabetes ([place])    Hypertension    Sleep apnea    Thyroid disorder    Past Surgical History Total Abdominal Hysterectomy Lithotripsy Cholecystectomy Breast Lumpectomy  No family history on file. Social History   Tobacco Use  Smoking Status Never  Smokeless Tobacco Never   Social History   Substance and Sexual Activity  Drug Use Not on file   Social History   Substance and Sexual Activity  Alcohol Use None    LABS:     Component Ref Range & Units 09/24/2022  Iron 28 - 170 ug/dL 49  TIBC 250 - 450 ug/dL 302  Saturation Ratios 10.4 - 31.8 % 16  UIBC ug/dL 253   Component Ref Range & Units 09/24/2022  Vitamin D 1, 25 (OH)2 Total pg/mL 20 Low   Vitamin D3 1, 25 (OH)2 pg/mL 20  Vitamin D2 1, 25 (OH)2 pg/mL <10     Component Ref Range & Units 09/24/2022  Vitamin B-12 180 - 914 pg/mL 381     Component Ref Range & Units 09/24/2022  Folate >5.9 ng/mL 18.1     Component Ref Range & Units 09/24/2022  Ferritin 11 - 307 ng/mL 295   STUDIES:  No studies found Exam: 2021-11-07 Digital Diagnostic Bilateral Mammogram with Tomosynthesis Impression: No mammographic or sonographic evidence of malignancy Exam: 11-07-21 Ultrasound Right Breast Impression: Targeted ultrasound is performed showing no significant change in the 3 o'clock right breast mass.    I,Jasmine M Lassiter,acting as a scribe for Derwood Kaplan, MD.,have documented all relevant documentation on the behalf of Derwood Kaplan, MD,as directed by  Derwood Kaplan, MD while in the presence of Derwood Kaplan, MD.

## 2022-04-18 ENCOUNTER — Inpatient Hospital Stay: Payer: Medicare Other | Attending: Oncology | Admitting: Oncology

## 2022-04-18 ENCOUNTER — Encounter: Payer: Self-pay | Admitting: Oncology

## 2022-04-18 ENCOUNTER — Telehealth: Payer: Self-pay | Admitting: Oncology

## 2022-04-18 ENCOUNTER — Inpatient Hospital Stay: Payer: Medicare Other

## 2022-04-18 VITALS — BP 134/57 | HR 71 | Temp 98.1°F | Resp 18 | Ht 64.0 in | Wt 187.4 lb

## 2022-04-18 DIAGNOSIS — Z170 Estrogen receptor positive status [ER+]: Secondary | ICD-10-CM

## 2022-04-18 DIAGNOSIS — Z780 Asymptomatic menopausal state: Secondary | ICD-10-CM

## 2022-04-18 DIAGNOSIS — M8580 Other specified disorders of bone density and structure, unspecified site: Secondary | ICD-10-CM | POA: Insufficient documentation

## 2022-04-18 DIAGNOSIS — C50211 Malignant neoplasm of upper-inner quadrant of right female breast: Secondary | ICD-10-CM

## 2022-04-18 LAB — CBC WITH DIFFERENTIAL (CANCER CENTER ONLY)
Abs Immature Granulocytes: 0.12 10*3/uL — ABNORMAL HIGH (ref 0.00–0.07)
Basophils Absolute: 0.1 10*3/uL (ref 0.0–0.1)
Basophils Relative: 1 %
Eosinophils Absolute: 0.4 10*3/uL (ref 0.0–0.5)
Eosinophils Relative: 5 %
HCT: 36.4 % (ref 36.0–46.0)
Hemoglobin: 11.6 g/dL — ABNORMAL LOW (ref 12.0–15.0)
Immature Granulocytes: 2 %
Lymphocytes Relative: 24 %
Lymphs Abs: 1.9 10*3/uL (ref 0.7–4.0)
MCH: 29.4 pg (ref 26.0–34.0)
MCHC: 31.9 g/dL (ref 30.0–36.0)
MCV: 92.4 fL (ref 80.0–100.0)
Monocytes Absolute: 0.6 10*3/uL (ref 0.1–1.0)
Monocytes Relative: 7 %
Neutro Abs: 4.8 10*3/uL (ref 1.7–7.7)
Neutrophils Relative %: 61 %
Platelet Count: 254 10*3/uL (ref 150–400)
RBC: 3.94 MIL/uL (ref 3.87–5.11)
RDW: 13.4 % (ref 11.5–15.5)
WBC Count: 7.9 10*3/uL (ref 4.0–10.5)
nRBC: 0 % (ref 0.0–0.2)

## 2022-04-18 LAB — CMP (CANCER CENTER ONLY)
ALT: 12 U/L (ref 0–44)
AST: 16 U/L (ref 15–41)
Albumin: 3.9 g/dL (ref 3.5–5.0)
Alkaline Phosphatase: 65 U/L (ref 38–126)
Anion gap: 8 (ref 5–15)
BUN: 49 mg/dL — ABNORMAL HIGH (ref 8–23)
CO2: 24 mmol/L (ref 22–32)
Calcium: 10 mg/dL (ref 8.9–10.3)
Chloride: 105 mmol/L (ref 98–111)
Creatinine: 2.07 mg/dL — ABNORMAL HIGH (ref 0.44–1.00)
GFR, Estimated: 23 mL/min — ABNORMAL LOW (ref >60)
Glucose, Bld: 189 mg/dL — ABNORMAL HIGH (ref 70–99)
Potassium: 4.6 mmol/L (ref 3.5–5.1)
Sodium: 137 mmol/L (ref 135–145)
Total Bilirubin: 0.4 mg/dL (ref 0.3–1.2)
Total Protein: 7 g/dL (ref 6.5–8.1)

## 2022-04-18 NOTE — Telephone Encounter (Signed)
04/18/22 Next appt scheduled and confirmed with patient

## 2022-04-21 DIAGNOSIS — Z79899 Other long term (current) drug therapy: Secondary | ICD-10-CM | POA: Diagnosis not present

## 2022-04-21 DIAGNOSIS — E538 Deficiency of other specified B group vitamins: Secondary | ICD-10-CM | POA: Diagnosis not present

## 2022-04-21 DIAGNOSIS — E1121 Type 2 diabetes mellitus with diabetic nephropathy: Secondary | ICD-10-CM | POA: Diagnosis not present

## 2022-04-21 DIAGNOSIS — N184 Chronic kidney disease, stage 4 (severe): Secondary | ICD-10-CM | POA: Diagnosis not present

## 2022-04-21 DIAGNOSIS — G720 Drug-induced myopathy: Secondary | ICD-10-CM | POA: Diagnosis not present

## 2022-04-21 DIAGNOSIS — E785 Hyperlipidemia, unspecified: Secondary | ICD-10-CM | POA: Diagnosis not present

## 2022-04-22 ENCOUNTER — Telehealth: Payer: Self-pay

## 2022-04-22 NOTE — Telephone Encounter (Signed)
Message left for patient to call back regarding lab results.

## 2022-04-22 NOTE — Telephone Encounter (Signed)
-----   Message from Belva Chimes, LPN sent at [id number]  9:57 AM EST ----- Regarding: FW: call  ----- Message ----- From: Derwood Kaplan, MD Sent: 04/21/2022   3:42 PM EST To: Belva Chimes, LPN Subject: call                                           Tell her labs are stable and BS 189, but was non-fasting so not too bad (252 last time in Aug)

## 2022-05-26 DIAGNOSIS — Z6830 Body mass index (BMI) 30.0-30.9, adult: Secondary | ICD-10-CM | POA: Diagnosis not present

## 2022-05-26 DIAGNOSIS — N3001 Acute cystitis with hematuria: Secondary | ICD-10-CM | POA: Diagnosis not present

## 2022-05-26 DIAGNOSIS — R300 Dysuria: Secondary | ICD-10-CM | POA: Diagnosis not present

## 2022-05-30 DIAGNOSIS — N281 Cyst of kidney, acquired: Secondary | ICD-10-CM | POA: Diagnosis not present

## 2022-05-30 DIAGNOSIS — I10 Essential (primary) hypertension: Secondary | ICD-10-CM | POA: Diagnosis not present

## 2022-05-30 DIAGNOSIS — R310 Gross hematuria: Secondary | ICD-10-CM | POA: Diagnosis not present

## 2022-05-30 DIAGNOSIS — K5730 Diverticulosis of large intestine without perforation or abscess without bleeding: Secondary | ICD-10-CM | POA: Diagnosis not present

## 2022-05-30 DIAGNOSIS — Z79899 Other long term (current) drug therapy: Secondary | ICD-10-CM | POA: Diagnosis not present

## 2022-05-30 DIAGNOSIS — E119 Type 2 diabetes mellitus without complications: Secondary | ICD-10-CM | POA: Diagnosis not present

## 2022-06-17 DIAGNOSIS — R310 Gross hematuria: Secondary | ICD-10-CM | POA: Diagnosis not present

## 2022-06-17 DIAGNOSIS — Z79899 Other long term (current) drug therapy: Secondary | ICD-10-CM | POA: Diagnosis not present

## 2022-06-18 DIAGNOSIS — R310 Gross hematuria: Secondary | ICD-10-CM | POA: Diagnosis not present

## 2022-06-30 DIAGNOSIS — R310 Gross hematuria: Secondary | ICD-10-CM | POA: Diagnosis not present

## 2022-06-30 DIAGNOSIS — Z87442 Personal history of urinary calculi: Secondary | ICD-10-CM | POA: Diagnosis not present

## 2022-06-30 DIAGNOSIS — R319 Hematuria, unspecified: Secondary | ICD-10-CM | POA: Diagnosis not present

## 2022-07-17 DIAGNOSIS — R310 Gross hematuria: Secondary | ICD-10-CM | POA: Diagnosis not present

## 2022-08-20 DIAGNOSIS — R0602 Shortness of breath: Secondary | ICD-10-CM | POA: Diagnosis not present

## 2022-08-20 DIAGNOSIS — R9430 Abnormal result of cardiovascular function study, unspecified: Secondary | ICD-10-CM | POA: Diagnosis not present

## 2022-08-20 DIAGNOSIS — E538 Deficiency of other specified B group vitamins: Secondary | ICD-10-CM | POA: Diagnosis not present

## 2022-08-20 DIAGNOSIS — E1121 Type 2 diabetes mellitus with diabetic nephropathy: Secondary | ICD-10-CM | POA: Diagnosis not present

## 2022-08-20 DIAGNOSIS — N184 Chronic kidney disease, stage 4 (severe): Secondary | ICD-10-CM | POA: Diagnosis not present

## 2022-08-20 DIAGNOSIS — Z79899 Other long term (current) drug therapy: Secondary | ICD-10-CM | POA: Diagnosis not present

## 2022-08-20 DIAGNOSIS — R609 Edema, unspecified: Secondary | ICD-10-CM | POA: Diagnosis not present

## 2022-08-25 DIAGNOSIS — E1121 Type 2 diabetes mellitus with diabetic nephropathy: Secondary | ICD-10-CM | POA: Diagnosis not present

## 2022-08-25 DIAGNOSIS — N3001 Acute cystitis with hematuria: Secondary | ICD-10-CM | POA: Diagnosis not present

## 2022-08-25 DIAGNOSIS — R300 Dysuria: Secondary | ICD-10-CM | POA: Diagnosis not present

## 2022-08-29 DIAGNOSIS — R0609 Other forms of dyspnea: Secondary | ICD-10-CM | POA: Diagnosis not present

## 2022-08-29 DIAGNOSIS — R0602 Shortness of breath: Secondary | ICD-10-CM | POA: Diagnosis not present

## 2022-09-03 DIAGNOSIS — N952 Postmenopausal atrophic vaginitis: Secondary | ICD-10-CM | POA: Diagnosis not present

## 2022-09-03 DIAGNOSIS — R310 Gross hematuria: Secondary | ICD-10-CM | POA: Diagnosis not present

## 2022-09-12 DIAGNOSIS — G4733 Obstructive sleep apnea (adult) (pediatric): Secondary | ICD-10-CM | POA: Diagnosis not present

## 2022-09-12 DIAGNOSIS — N184 Chronic kidney disease, stage 4 (severe): Secondary | ICD-10-CM | POA: Diagnosis not present

## 2022-09-12 DIAGNOSIS — R9430 Abnormal result of cardiovascular function study, unspecified: Secondary | ICD-10-CM | POA: Diagnosis not present

## 2022-09-12 DIAGNOSIS — R053 Chronic cough: Secondary | ICD-10-CM | POA: Diagnosis not present

## 2022-09-17 ENCOUNTER — Encounter: Payer: Self-pay | Admitting: Family Medicine

## 2022-09-24 DIAGNOSIS — N184 Chronic kidney disease, stage 4 (severe): Secondary | ICD-10-CM | POA: Diagnosis not present

## 2022-09-24 DIAGNOSIS — E785 Hyperlipidemia, unspecified: Secondary | ICD-10-CM | POA: Diagnosis not present

## 2022-09-24 DIAGNOSIS — G720 Drug-induced myopathy: Secondary | ICD-10-CM | POA: Diagnosis not present

## 2022-09-24 DIAGNOSIS — Z0000 Encounter for general adult medical examination without abnormal findings: Secondary | ICD-10-CM | POA: Diagnosis not present

## 2022-09-24 DIAGNOSIS — E1121 Type 2 diabetes mellitus with diabetic nephropathy: Secondary | ICD-10-CM | POA: Diagnosis not present

## 2022-09-25 DIAGNOSIS — E538 Deficiency of other specified B group vitamins: Secondary | ICD-10-CM | POA: Diagnosis not present

## 2022-10-01 DIAGNOSIS — E538 Deficiency of other specified B group vitamins: Secondary | ICD-10-CM | POA: Diagnosis not present

## 2022-10-13 DIAGNOSIS — Z1231 Encounter for screening mammogram for malignant neoplasm of breast: Secondary | ICD-10-CM | POA: Diagnosis not present

## 2022-10-17 DIAGNOSIS — D519 Vitamin B12 deficiency anemia, unspecified: Secondary | ICD-10-CM | POA: Diagnosis not present

## 2022-10-21 DIAGNOSIS — C50211 Malignant neoplasm of upper-inner quadrant of right female breast: Secondary | ICD-10-CM | POA: Diagnosis not present

## 2022-10-21 DIAGNOSIS — Z170 Estrogen receptor positive status [ER+]: Secondary | ICD-10-CM | POA: Diagnosis not present

## 2022-11-28 DIAGNOSIS — Z79899 Other long term (current) drug therapy: Secondary | ICD-10-CM | POA: Diagnosis not present

## 2022-11-28 DIAGNOSIS — E538 Deficiency of other specified B group vitamins: Secondary | ICD-10-CM | POA: Diagnosis not present

## 2022-11-28 DIAGNOSIS — N184 Chronic kidney disease, stage 4 (severe): Secondary | ICD-10-CM | POA: Diagnosis not present

## 2022-11-28 DIAGNOSIS — E1121 Type 2 diabetes mellitus with diabetic nephropathy: Secondary | ICD-10-CM | POA: Diagnosis not present

## 2022-11-28 DIAGNOSIS — R300 Dysuria: Secondary | ICD-10-CM | POA: Diagnosis not present

## 2022-11-28 DIAGNOSIS — E039 Hypothyroidism, unspecified: Secondary | ICD-10-CM | POA: Diagnosis not present

## 2022-11-28 DIAGNOSIS — R609 Edema, unspecified: Secondary | ICD-10-CM | POA: Diagnosis not present

## 2022-12-04 DIAGNOSIS — N390 Urinary tract infection, site not specified: Secondary | ICD-10-CM | POA: Diagnosis not present

## 2022-12-04 DIAGNOSIS — R310 Gross hematuria: Secondary | ICD-10-CM | POA: Diagnosis not present

## 2022-12-10 DIAGNOSIS — N390 Urinary tract infection, site not specified: Secondary | ICD-10-CM | POA: Diagnosis not present

## 2022-12-18 DIAGNOSIS — E1121 Type 2 diabetes mellitus with diabetic nephropathy: Secondary | ICD-10-CM | POA: Diagnosis not present

## 2022-12-18 DIAGNOSIS — I10 Essential (primary) hypertension: Secondary | ICD-10-CM | POA: Diagnosis not present

## 2022-12-18 DIAGNOSIS — J40 Bronchitis, not specified as acute or chronic: Secondary | ICD-10-CM | POA: Diagnosis not present

## 2022-12-18 DIAGNOSIS — E039 Hypothyroidism, unspecified: Secondary | ICD-10-CM | POA: Diagnosis not present

## 2023-03-20 DIAGNOSIS — R0602 Shortness of breath: Secondary | ICD-10-CM | POA: Diagnosis not present

## 2023-03-20 DIAGNOSIS — E538 Deficiency of other specified B group vitamins: Secondary | ICD-10-CM | POA: Diagnosis not present

## 2023-03-20 DIAGNOSIS — M6281 Muscle weakness (generalized): Secondary | ICD-10-CM | POA: Diagnosis not present

## 2023-03-20 DIAGNOSIS — R29898 Other symptoms and signs involving the musculoskeletal system: Secondary | ICD-10-CM | POA: Diagnosis not present

## 2023-03-24 DIAGNOSIS — I081 Rheumatic disorders of both mitral and tricuspid valves: Secondary | ICD-10-CM | POA: Diagnosis not present

## 2023-03-24 DIAGNOSIS — E1122 Type 2 diabetes mellitus with diabetic chronic kidney disease: Secondary | ICD-10-CM | POA: Diagnosis not present

## 2023-03-24 DIAGNOSIS — N184 Chronic kidney disease, stage 4 (severe): Secondary | ICD-10-CM | POA: Diagnosis not present

## 2023-03-24 DIAGNOSIS — Z853 Personal history of malignant neoplasm of breast: Secondary | ICD-10-CM | POA: Diagnosis not present

## 2023-03-24 DIAGNOSIS — G4733 Obstructive sleep apnea (adult) (pediatric): Secondary | ICD-10-CM | POA: Diagnosis not present

## 2023-03-24 DIAGNOSIS — E663 Overweight: Secondary | ICD-10-CM | POA: Diagnosis not present

## 2023-03-24 DIAGNOSIS — Z6830 Body mass index (BMI) 30.0-30.9, adult: Secondary | ICD-10-CM | POA: Diagnosis not present

## 2023-03-24 DIAGNOSIS — J410 Simple chronic bronchitis: Secondary | ICD-10-CM | POA: Diagnosis not present

## 2023-03-24 DIAGNOSIS — Z79899 Other long term (current) drug therapy: Secondary | ICD-10-CM | POA: Diagnosis not present

## 2023-03-24 DIAGNOSIS — G40409 Other generalized epilepsy and epileptic syndromes, not intractable, without status epilepticus: Secondary | ICD-10-CM | POA: Diagnosis not present

## 2023-03-24 DIAGNOSIS — F01B18 Vascular dementia, moderate, with other behavioral disturbance: Secondary | ICD-10-CM | POA: Diagnosis not present

## 2023-03-24 DIAGNOSIS — E538 Deficiency of other specified B group vitamins: Secondary | ICD-10-CM | POA: Diagnosis not present

## 2023-03-24 DIAGNOSIS — G720 Drug-induced myopathy: Secondary | ICD-10-CM | POA: Diagnosis not present

## 2023-03-24 DIAGNOSIS — I2510 Atherosclerotic heart disease of native coronary artery without angina pectoris: Secondary | ICD-10-CM | POA: Diagnosis not present

## 2023-03-24 DIAGNOSIS — I129 Hypertensive chronic kidney disease with stage 1 through stage 4 chronic kidney disease, or unspecified chronic kidney disease: Secondary | ICD-10-CM | POA: Diagnosis not present

## 2023-03-24 DIAGNOSIS — E785 Hyperlipidemia, unspecified: Secondary | ICD-10-CM | POA: Diagnosis not present

## 2023-03-24 DIAGNOSIS — Z556 Problems related to health literacy: Secondary | ICD-10-CM | POA: Diagnosis not present

## 2023-03-24 DIAGNOSIS — Z7984 Long term (current) use of oral hypoglycemic drugs: Secondary | ICD-10-CM | POA: Diagnosis not present

## 2023-03-24 DIAGNOSIS — M810 Age-related osteoporosis without current pathological fracture: Secondary | ICD-10-CM | POA: Diagnosis not present

## 2023-03-24 DIAGNOSIS — I700 Atherosclerosis of aorta: Secondary | ICD-10-CM | POA: Diagnosis not present

## 2023-03-24 DIAGNOSIS — I672 Cerebral atherosclerosis: Secondary | ICD-10-CM | POA: Diagnosis not present

## 2023-03-24 DIAGNOSIS — E039 Hypothyroidism, unspecified: Secondary | ICD-10-CM | POA: Diagnosis not present

## 2023-03-26 DIAGNOSIS — E1122 Type 2 diabetes mellitus with diabetic chronic kidney disease: Secondary | ICD-10-CM | POA: Diagnosis not present

## 2023-03-26 DIAGNOSIS — E538 Deficiency of other specified B group vitamins: Secondary | ICD-10-CM | POA: Diagnosis not present

## 2023-03-26 DIAGNOSIS — G40409 Other generalized epilepsy and epileptic syndromes, not intractable, without status epilepticus: Secondary | ICD-10-CM | POA: Diagnosis not present

## 2023-03-26 DIAGNOSIS — N184 Chronic kidney disease, stage 4 (severe): Secondary | ICD-10-CM | POA: Diagnosis not present

## 2023-03-26 DIAGNOSIS — I700 Atherosclerosis of aorta: Secondary | ICD-10-CM | POA: Diagnosis not present

## 2023-03-26 DIAGNOSIS — I129 Hypertensive chronic kidney disease with stage 1 through stage 4 chronic kidney disease, or unspecified chronic kidney disease: Secondary | ICD-10-CM | POA: Diagnosis not present

## 2023-03-27 DIAGNOSIS — I700 Atherosclerosis of aorta: Secondary | ICD-10-CM | POA: Diagnosis not present

## 2023-03-27 DIAGNOSIS — G40409 Other generalized epilepsy and epileptic syndromes, not intractable, without status epilepticus: Secondary | ICD-10-CM | POA: Diagnosis not present

## 2023-03-27 DIAGNOSIS — E1122 Type 2 diabetes mellitus with diabetic chronic kidney disease: Secondary | ICD-10-CM | POA: Diagnosis not present

## 2023-03-27 DIAGNOSIS — I129 Hypertensive chronic kidney disease with stage 1 through stage 4 chronic kidney disease, or unspecified chronic kidney disease: Secondary | ICD-10-CM | POA: Diagnosis not present

## 2023-03-27 DIAGNOSIS — N184 Chronic kidney disease, stage 4 (severe): Secondary | ICD-10-CM | POA: Diagnosis not present

## 2023-03-27 DIAGNOSIS — E538 Deficiency of other specified B group vitamins: Secondary | ICD-10-CM | POA: Diagnosis not present

## 2023-03-30 DIAGNOSIS — R300 Dysuria: Secondary | ICD-10-CM | POA: Diagnosis not present

## 2023-03-30 DIAGNOSIS — Z6829 Body mass index (BMI) 29.0-29.9, adult: Secondary | ICD-10-CM | POA: Diagnosis not present

## 2023-03-30 DIAGNOSIS — N184 Chronic kidney disease, stage 4 (severe): Secondary | ICD-10-CM | POA: Diagnosis not present

## 2023-03-30 DIAGNOSIS — G40409 Other generalized epilepsy and epileptic syndromes, not intractable, without status epilepticus: Secondary | ICD-10-CM | POA: Diagnosis not present

## 2023-03-30 DIAGNOSIS — E1122 Type 2 diabetes mellitus with diabetic chronic kidney disease: Secondary | ICD-10-CM | POA: Diagnosis not present

## 2023-03-30 DIAGNOSIS — E538 Deficiency of other specified B group vitamins: Secondary | ICD-10-CM | POA: Diagnosis not present

## 2023-03-30 DIAGNOSIS — I700 Atherosclerosis of aorta: Secondary | ICD-10-CM | POA: Diagnosis not present

## 2023-03-30 DIAGNOSIS — I129 Hypertensive chronic kidney disease with stage 1 through stage 4 chronic kidney disease, or unspecified chronic kidney disease: Secondary | ICD-10-CM | POA: Diagnosis not present

## 2023-03-31 DIAGNOSIS — R300 Dysuria: Secondary | ICD-10-CM | POA: Diagnosis not present

## 2023-04-01 DIAGNOSIS — N184 Chronic kidney disease, stage 4 (severe): Secondary | ICD-10-CM | POA: Diagnosis not present

## 2023-04-01 DIAGNOSIS — E538 Deficiency of other specified B group vitamins: Secondary | ICD-10-CM | POA: Diagnosis not present

## 2023-04-01 DIAGNOSIS — I129 Hypertensive chronic kidney disease with stage 1 through stage 4 chronic kidney disease, or unspecified chronic kidney disease: Secondary | ICD-10-CM | POA: Diagnosis not present

## 2023-04-01 DIAGNOSIS — I700 Atherosclerosis of aorta: Secondary | ICD-10-CM | POA: Diagnosis not present

## 2023-04-01 DIAGNOSIS — E1122 Type 2 diabetes mellitus with diabetic chronic kidney disease: Secondary | ICD-10-CM | POA: Diagnosis not present

## 2023-04-01 DIAGNOSIS — G40409 Other generalized epilepsy and epileptic syndromes, not intractable, without status epilepticus: Secondary | ICD-10-CM | POA: Diagnosis not present

## 2023-04-06 DIAGNOSIS — E1122 Type 2 diabetes mellitus with diabetic chronic kidney disease: Secondary | ICD-10-CM | POA: Diagnosis not present

## 2023-04-06 DIAGNOSIS — I700 Atherosclerosis of aorta: Secondary | ICD-10-CM | POA: Diagnosis not present

## 2023-04-06 DIAGNOSIS — I129 Hypertensive chronic kidney disease with stage 1 through stage 4 chronic kidney disease, or unspecified chronic kidney disease: Secondary | ICD-10-CM | POA: Diagnosis not present

## 2023-04-06 DIAGNOSIS — N184 Chronic kidney disease, stage 4 (severe): Secondary | ICD-10-CM | POA: Diagnosis not present

## 2023-04-06 DIAGNOSIS — E538 Deficiency of other specified B group vitamins: Secondary | ICD-10-CM | POA: Diagnosis not present

## 2023-04-06 DIAGNOSIS — G40409 Other generalized epilepsy and epileptic syndromes, not intractable, without status epilepticus: Secondary | ICD-10-CM | POA: Diagnosis not present

## 2023-04-07 DIAGNOSIS — E538 Deficiency of other specified B group vitamins: Secondary | ICD-10-CM | POA: Diagnosis not present

## 2023-04-07 DIAGNOSIS — G40409 Other generalized epilepsy and epileptic syndromes, not intractable, without status epilepticus: Secondary | ICD-10-CM | POA: Diagnosis not present

## 2023-04-07 DIAGNOSIS — I129 Hypertensive chronic kidney disease with stage 1 through stage 4 chronic kidney disease, or unspecified chronic kidney disease: Secondary | ICD-10-CM | POA: Diagnosis not present

## 2023-04-07 DIAGNOSIS — I700 Atherosclerosis of aorta: Secondary | ICD-10-CM | POA: Diagnosis not present

## 2023-04-07 DIAGNOSIS — E1122 Type 2 diabetes mellitus with diabetic chronic kidney disease: Secondary | ICD-10-CM | POA: Diagnosis not present

## 2023-04-07 DIAGNOSIS — N184 Chronic kidney disease, stage 4 (severe): Secondary | ICD-10-CM | POA: Diagnosis not present

## 2023-04-10 DIAGNOSIS — I700 Atherosclerosis of aorta: Secondary | ICD-10-CM | POA: Diagnosis not present

## 2023-04-10 DIAGNOSIS — E538 Deficiency of other specified B group vitamins: Secondary | ICD-10-CM | POA: Diagnosis not present

## 2023-04-10 DIAGNOSIS — E1122 Type 2 diabetes mellitus with diabetic chronic kidney disease: Secondary | ICD-10-CM | POA: Diagnosis not present

## 2023-04-10 DIAGNOSIS — G40409 Other generalized epilepsy and epileptic syndromes, not intractable, without status epilepticus: Secondary | ICD-10-CM | POA: Diagnosis not present

## 2023-04-10 DIAGNOSIS — I129 Hypertensive chronic kidney disease with stage 1 through stage 4 chronic kidney disease, or unspecified chronic kidney disease: Secondary | ICD-10-CM | POA: Diagnosis not present

## 2023-04-10 DIAGNOSIS — N184 Chronic kidney disease, stage 4 (severe): Secondary | ICD-10-CM | POA: Diagnosis not present

## 2023-04-13 DIAGNOSIS — I129 Hypertensive chronic kidney disease with stage 1 through stage 4 chronic kidney disease, or unspecified chronic kidney disease: Secondary | ICD-10-CM | POA: Diagnosis not present

## 2023-04-13 DIAGNOSIS — E538 Deficiency of other specified B group vitamins: Secondary | ICD-10-CM | POA: Diagnosis not present

## 2023-04-13 DIAGNOSIS — G40409 Other generalized epilepsy and epileptic syndromes, not intractable, without status epilepticus: Secondary | ICD-10-CM | POA: Diagnosis not present

## 2023-04-13 DIAGNOSIS — E1122 Type 2 diabetes mellitus with diabetic chronic kidney disease: Secondary | ICD-10-CM | POA: Diagnosis not present

## 2023-04-13 DIAGNOSIS — N184 Chronic kidney disease, stage 4 (severe): Secondary | ICD-10-CM | POA: Diagnosis not present

## 2023-04-13 DIAGNOSIS — I700 Atherosclerosis of aorta: Secondary | ICD-10-CM | POA: Diagnosis not present

## 2023-04-15 DIAGNOSIS — G40409 Other generalized epilepsy and epileptic syndromes, not intractable, without status epilepticus: Secondary | ICD-10-CM | POA: Diagnosis not present

## 2023-04-15 DIAGNOSIS — N184 Chronic kidney disease, stage 4 (severe): Secondary | ICD-10-CM | POA: Diagnosis not present

## 2023-04-15 DIAGNOSIS — I129 Hypertensive chronic kidney disease with stage 1 through stage 4 chronic kidney disease, or unspecified chronic kidney disease: Secondary | ICD-10-CM | POA: Diagnosis not present

## 2023-04-15 DIAGNOSIS — I700 Atherosclerosis of aorta: Secondary | ICD-10-CM | POA: Diagnosis not present

## 2023-04-15 DIAGNOSIS — E538 Deficiency of other specified B group vitamins: Secondary | ICD-10-CM | POA: Diagnosis not present

## 2023-04-15 DIAGNOSIS — E1122 Type 2 diabetes mellitus with diabetic chronic kidney disease: Secondary | ICD-10-CM | POA: Diagnosis not present

## 2023-04-16 NOTE — Progress Notes (Incomplete)
 [facility]  [address] [place],  [place]  [address] [phone]  Clinic Day:  04/16/23   Referring physician:   ASSESSMENT & PLAN:  Assessment: Stage IIA breast cancer This was a T2 N0 M0 right breast carcinoma, and treated with surgery but did not tolerate hormonal therapy. She has no evidence of disease.  Hypercalcemia This was mild and not likely related to malignancy. I will check vitamin D levels and repeat her calcium.  I recommend she decrease her milk intake.  Osteopenia She was on Prolia injections through Dr.Street.  Plan Her annual mammogram and ultrasound in August, 2023 was clear. Her labs today are pending. I will see her back in a year with CBC and CMP. They understand and agree with this plan of care. Their questions were answered and I would be glad to see her sooner if problems arise related to her breast cancer.  I provided 15 minutes for this appointment and over 50% was spent in face to face discussion.  Dellia Beckwith, MD  [facility] - [facility] DEPT OF [facility] [address] [place] [address] Dept: [phone] Dept Fax: [phone]  No orders of the defined types were placed in this encounter.  CHIEF COMPLAIN:  CC: Stage IIA breast cancer  Current Treatment:  HISTORY OF PRESENT ILLNESS:  This is an 83 year old woman with a history of a right breast cancer stage IIA diagnosed in April of 2018.  This was a grade 2 invasive ductal carcinoma with positive estrogen and progesterone receptors, negative HER 2 and Ki67 of 7%. She was treated with lumpectomy and the mass was 2.4 cm with 2 negative sentinel nodes for a T2 N0 M0. Endopredict score was 2.5, which is low risk for recurrence, approximately 4.6% risk of distant recurrence in the next 10 years.  Five years ago she was noted to have a postop seroma over 2 cm. She also has osteopenia, and has been on Prolia  through her PCP. She was placed on anastrazole in June of 2018 but was discontinued the following year due to memory difficulties. A CT of the brain revealed lacunar infarctions of the basal ganglia and thalami and mild chronic small vessel white matter disease. I did try her on letrozole in December of 2019 but she developed a rash and this was stopped in February.  INTERVAL HISTORY:  Ski is here today for a follow-up visit for her stage IIA breast cancer. Patient states that she feels *** and ***.    She denies signs of infection such as sore throat, sinus drainage, cough, or urinary symptoms.  She denies fevers or recurrent chills. She denies pain. She denies nausea, vomiting, chest pain, dyspnea or cough. Her appetite is *** and her weight {Weight change:10426}.  Patient states that she feels ok and has no complaints. Her annual mammogram and ultrasound in August, 2023  was clear. Her labs today are pending. I will see her back in a year with CBC and CMP.  She denies signs of infection such as sore throat, sinus drainage, cough, or urinary symptoms.  She denies fevers or recurrent chills. She denies pain. She denies nausea, vomiting, chest pain, dyspnea or cough. Her weight has increased 7 pounds over last 7 months . She is accompanied at today's visit with her husband.   REVIEW OF SYMPTOMS:  Review of Systems  Constitutional:  Positive for fatigue. Negative for appetite  change, chills, diaphoresis, fever and unexpected weight change.  HENT:  Negative.  Negative for hearing loss, lump/mass, mouth sores, nosebleeds, sore throat, tinnitus, trouble swallowing and voice change.   Eyes: Negative.  Negative for eye problems and icterus.  Respiratory: Negative.  Negative for chest tightness, cough, hemoptysis, shortness of breath and wheezing.   Cardiovascular: Negative.  Negative for chest pain, leg swelling and palpitations.  Gastrointestinal: Negative.  Negative for abdominal distention,  abdominal pain, blood in stool, constipation, diarrhea, nausea, rectal pain and vomiting.  Endocrine: Negative.   Genitourinary: Negative.  Negative for bladder incontinence, difficulty urinating, dyspareunia, dysuria, frequency, hematuria, menstrual problem, nocturia, pelvic pain, vaginal bleeding and vaginal discharge.   Musculoskeletal:  Negative for arthralgias, back pain, flank pain, gait problem, myalgias, neck pain and neck stiffness.  Skin: Negative.  Negative for itching, rash and wound.  Neurological:  Positive for extremity weakness (generalized). Negative for dizziness, gait problem, headaches, light-headedness, numbness, seizures and speech difficulty.       She is using a cane with generalized weakness and seems dazed and confused.  Hematological: Negative.  Negative for adenopathy. Does not bruise/bleed easily.  Psychiatric/Behavioral:  Positive for confusion. Negative for decreased concentration, depression, sleep disturbance and suicidal ideas. The patient is not nervous/anxious.        Memory loss   VITALS:  There were no vitals filed for this visit. Wt Readings from Last 3 Encounters:  04/18/22 187 lb 6.4 oz (85 kg)  09/23/21 180 lb 11.2 oz (82 kg)   PHYSICAL EXAM:  Physical Exam Vitals and nursing note reviewed. Exam conducted with a chaperone present.  Constitutional:      General: She is not in acute distress.    Appearance: Normal appearance. She is normal weight. She is not ill-appearing, toxic-appearing or diaphoretic.  HENT:     Head: Normocephalic and atraumatic.     Right Ear: Tympanic membrane, ear canal and external ear normal. There is no impacted cerumen.     Left Ear: Tympanic membrane, ear canal and external ear normal. There is no impacted cerumen.     Nose: Nose normal. No congestion or rhinorrhea.     Mouth/Throat:     Mouth: Mucous membranes are moist.     Pharynx: Oropharynx is clear. No oropharyngeal exudate or posterior oropharyngeal erythema.   Eyes:     General: No scleral icterus.       Right eye: No discharge.        Left eye: No discharge.     Extraocular Movements: Extraocular movements intact.     Conjunctiva/sclera: Conjunctivae normal.     Pupils: Pupils are equal, round, and reactive to light.  Neck:     Vascular: No carotid bruit.  Cardiovascular:     Rate and Rhythm: Normal rate and regular rhythm.     Pulses: Normal pulses.     Heart sounds: Normal heart sounds. No murmur heard.    No friction rub. No gallop.  Pulmonary:     Effort: Pulmonary effort is normal. No respiratory distress.     Breath sounds: Normal breath sounds. No stridor. No wheezing, rhonchi or rales.  Chest:     Chest wall: No tenderness.  Breasts:    Right: Normal.     Left: Normal.     Comments: Inversion of the right nipple. No masses in either breast.  Abdominal:     General: Bowel sounds are normal. There is no distension.     Palpations: Abdomen is soft.  There is no hepatomegaly, splenomegaly or mass.     Tenderness: There is no abdominal tenderness. There is no right CVA tenderness, left CVA tenderness, guarding or rebound.     Hernia: No hernia is present.  Musculoskeletal:        General: No swelling, tenderness, deformity or signs of injury. Normal range of motion.     Cervical back: Normal range of motion and neck supple. No rigidity or tenderness.     Right lower leg: No edema.     Left lower leg: No edema.  Lymphadenopathy:     Cervical: No cervical adenopathy.     Right cervical: No superficial, deep or posterior cervical adenopathy.    Left cervical: No superficial, deep or posterior cervical adenopathy.     Upper Body:     Right upper body: No supraclavicular, axillary or pectoral adenopathy.     Left upper body: No supraclavicular, axillary or pectoral adenopathy.  Skin:    General: Skin is warm and dry.     Coloration: Skin is not jaundiced or pale.     Findings: No bruising, erythema, lesion or rash.   Neurological:     General: No focal deficit present.     Mental Status: She is alert. Mental status is at baseline. She is disoriented.     Cranial Nerves: No cranial nerve deficit.     Sensory: No sensory deficit.     Motor: Weakness present.     Coordination: Coordination normal.     Gait: Gait normal.     Deep Tendon Reflexes: Reflexes normal.  Psychiatric:        Mood and Affect: Mood normal.        Behavior: Behavior normal.        Thought Content: Thought content normal.        Judgment: Judgment normal.    HISTORY:   Past Medical History:  Diagnosis Date   Diabetes (HCC)    Hypertension    Sleep apnea    Thyroid disorder    Past Surgical History Total Abdominal Hysterectomy Lithotripsy Cholecystectomy Breast Lumpectomy  No family history on file. Social History   Tobacco Use  Smoking Status Never  Smokeless Tobacco Never   Social History   Substance and Sexual Activity  Drug Use Not on file   Social History   Substance and Sexual Activity  Alcohol Use Not on file    LABS:   Component Ref Range & Units (hover) 12 mo ago  WBC Count 7.9  RBC 3.94  Hemoglobin 11.6 Low   HCT 36.4  MCV 92.4  MCH 29.4  MCHC 31.9  RDW 13.4  Platelet Count 254   Component Ref Range & Units (hover) 12 mo ago  Sodium 137  Potassium 4.6  Chloride 105  CO2 24  Glucose, Bld 189 High   BUN 49 High   Creatinine 2.07 High   Calcium 10.0  Total Protein 7.0  Albumin 3.9  AST 16  ALT 12  Alkaline Phosphatase 65  Total Bilirubin 0.4   STUDIES:  No studies found.    I,Jasmine M Lassiter,acting as a scribe for Dellia Beckwith, MD.,have documented all relevant documentation on the behalf of Dellia Beckwith, MD,as directed by  Dellia Beckwith, MD while in the presence of Dellia Beckwith, MD.

## 2023-04-17 ENCOUNTER — Inpatient Hospital Stay: Payer: Medicare Other | Attending: Oncology | Admitting: Oncology

## 2023-04-17 ENCOUNTER — Inpatient Hospital Stay: Payer: Medicare Other

## 2023-04-17 DIAGNOSIS — E538 Deficiency of other specified B group vitamins: Secondary | ICD-10-CM | POA: Diagnosis not present

## 2023-04-17 DIAGNOSIS — I129 Hypertensive chronic kidney disease with stage 1 through stage 4 chronic kidney disease, or unspecified chronic kidney disease: Secondary | ICD-10-CM | POA: Diagnosis not present

## 2023-04-17 DIAGNOSIS — E1122 Type 2 diabetes mellitus with diabetic chronic kidney disease: Secondary | ICD-10-CM | POA: Diagnosis not present

## 2023-04-17 DIAGNOSIS — N184 Chronic kidney disease, stage 4 (severe): Secondary | ICD-10-CM | POA: Diagnosis not present

## 2023-04-17 DIAGNOSIS — G40409 Other generalized epilepsy and epileptic syndromes, not intractable, without status epilepticus: Secondary | ICD-10-CM | POA: Diagnosis not present

## 2023-04-17 DIAGNOSIS — I700 Atherosclerosis of aorta: Secondary | ICD-10-CM | POA: Diagnosis not present

## 2023-04-17 DIAGNOSIS — N390 Urinary tract infection, site not specified: Secondary | ICD-10-CM | POA: Diagnosis not present

## 2023-04-21 DIAGNOSIS — I129 Hypertensive chronic kidney disease with stage 1 through stage 4 chronic kidney disease, or unspecified chronic kidney disease: Secondary | ICD-10-CM | POA: Diagnosis not present

## 2023-04-21 DIAGNOSIS — I700 Atherosclerosis of aorta: Secondary | ICD-10-CM | POA: Diagnosis not present

## 2023-04-21 DIAGNOSIS — G40409 Other generalized epilepsy and epileptic syndromes, not intractable, without status epilepticus: Secondary | ICD-10-CM | POA: Diagnosis not present

## 2023-04-21 DIAGNOSIS — E1122 Type 2 diabetes mellitus with diabetic chronic kidney disease: Secondary | ICD-10-CM | POA: Diagnosis not present

## 2023-04-21 DIAGNOSIS — N184 Chronic kidney disease, stage 4 (severe): Secondary | ICD-10-CM | POA: Diagnosis not present

## 2023-04-21 DIAGNOSIS — E538 Deficiency of other specified B group vitamins: Secondary | ICD-10-CM | POA: Diagnosis not present

## 2023-04-22 DIAGNOSIS — E538 Deficiency of other specified B group vitamins: Secondary | ICD-10-CM | POA: Diagnosis not present

## 2023-04-22 DIAGNOSIS — I129 Hypertensive chronic kidney disease with stage 1 through stage 4 chronic kidney disease, or unspecified chronic kidney disease: Secondary | ICD-10-CM | POA: Diagnosis not present

## 2023-04-22 DIAGNOSIS — E1122 Type 2 diabetes mellitus with diabetic chronic kidney disease: Secondary | ICD-10-CM | POA: Diagnosis not present

## 2023-04-22 DIAGNOSIS — I700 Atherosclerosis of aorta: Secondary | ICD-10-CM | POA: Diagnosis not present

## 2023-04-22 DIAGNOSIS — G40409 Other generalized epilepsy and epileptic syndromes, not intractable, without status epilepticus: Secondary | ICD-10-CM | POA: Diagnosis not present

## 2023-04-22 DIAGNOSIS — N184 Chronic kidney disease, stage 4 (severe): Secondary | ICD-10-CM | POA: Diagnosis not present

## 2023-04-23 DIAGNOSIS — I129 Hypertensive chronic kidney disease with stage 1 through stage 4 chronic kidney disease, or unspecified chronic kidney disease: Secondary | ICD-10-CM | POA: Diagnosis not present

## 2023-04-23 DIAGNOSIS — I700 Atherosclerosis of aorta: Secondary | ICD-10-CM | POA: Diagnosis not present

## 2023-04-23 DIAGNOSIS — I081 Rheumatic disorders of both mitral and tricuspid valves: Secondary | ICD-10-CM | POA: Diagnosis not present

## 2023-04-23 DIAGNOSIS — G4733 Obstructive sleep apnea (adult) (pediatric): Secondary | ICD-10-CM | POA: Diagnosis not present

## 2023-04-23 DIAGNOSIS — E538 Deficiency of other specified B group vitamins: Secondary | ICD-10-CM | POA: Diagnosis not present

## 2023-04-23 DIAGNOSIS — E663 Overweight: Secondary | ICD-10-CM | POA: Diagnosis not present

## 2023-04-23 DIAGNOSIS — E039 Hypothyroidism, unspecified: Secondary | ICD-10-CM | POA: Diagnosis not present

## 2023-04-23 DIAGNOSIS — I2510 Atherosclerotic heart disease of native coronary artery without angina pectoris: Secondary | ICD-10-CM | POA: Diagnosis not present

## 2023-04-23 DIAGNOSIS — N184 Chronic kidney disease, stage 4 (severe): Secondary | ICD-10-CM | POA: Diagnosis not present

## 2023-04-23 DIAGNOSIS — E785 Hyperlipidemia, unspecified: Secondary | ICD-10-CM | POA: Diagnosis not present

## 2023-04-23 DIAGNOSIS — J410 Simple chronic bronchitis: Secondary | ICD-10-CM | POA: Diagnosis not present

## 2023-04-23 DIAGNOSIS — G720 Drug-induced myopathy: Secondary | ICD-10-CM | POA: Diagnosis not present

## 2023-04-23 DIAGNOSIS — Z556 Problems related to health literacy: Secondary | ICD-10-CM | POA: Diagnosis not present

## 2023-04-23 DIAGNOSIS — Z7984 Long term (current) use of oral hypoglycemic drugs: Secondary | ICD-10-CM | POA: Diagnosis not present

## 2023-04-23 DIAGNOSIS — Z6830 Body mass index (BMI) 30.0-30.9, adult: Secondary | ICD-10-CM | POA: Diagnosis not present

## 2023-04-23 DIAGNOSIS — M810 Age-related osteoporosis without current pathological fracture: Secondary | ICD-10-CM | POA: Diagnosis not present

## 2023-04-23 DIAGNOSIS — I672 Cerebral atherosclerosis: Secondary | ICD-10-CM | POA: Diagnosis not present

## 2023-04-23 DIAGNOSIS — F01B18 Vascular dementia, moderate, with other behavioral disturbance: Secondary | ICD-10-CM | POA: Diagnosis not present

## 2023-04-23 DIAGNOSIS — Z853 Personal history of malignant neoplasm of breast: Secondary | ICD-10-CM | POA: Diagnosis not present

## 2023-04-23 DIAGNOSIS — E1122 Type 2 diabetes mellitus with diabetic chronic kidney disease: Secondary | ICD-10-CM | POA: Diagnosis not present

## 2023-04-23 DIAGNOSIS — G40409 Other generalized epilepsy and epileptic syndromes, not intractable, without status epilepticus: Secondary | ICD-10-CM | POA: Diagnosis not present

## 2023-04-23 DIAGNOSIS — Z79899 Other long term (current) drug therapy: Secondary | ICD-10-CM | POA: Diagnosis not present

## 2023-04-24 DIAGNOSIS — N184 Chronic kidney disease, stage 4 (severe): Secondary | ICD-10-CM | POA: Diagnosis not present

## 2023-04-24 DIAGNOSIS — E1122 Type 2 diabetes mellitus with diabetic chronic kidney disease: Secondary | ICD-10-CM | POA: Diagnosis not present

## 2023-04-24 DIAGNOSIS — I129 Hypertensive chronic kidney disease with stage 1 through stage 4 chronic kidney disease, or unspecified chronic kidney disease: Secondary | ICD-10-CM | POA: Diagnosis not present

## 2023-04-24 DIAGNOSIS — E538 Deficiency of other specified B group vitamins: Secondary | ICD-10-CM | POA: Diagnosis not present

## 2023-04-24 DIAGNOSIS — I700 Atherosclerosis of aorta: Secondary | ICD-10-CM | POA: Diagnosis not present

## 2023-04-24 DIAGNOSIS — G40409 Other generalized epilepsy and epileptic syndromes, not intractable, without status epilepticus: Secondary | ICD-10-CM | POA: Diagnosis not present

## 2023-04-27 DIAGNOSIS — N390 Urinary tract infection, site not specified: Secondary | ICD-10-CM | POA: Diagnosis not present

## 2023-04-27 DIAGNOSIS — E538 Deficiency of other specified B group vitamins: Secondary | ICD-10-CM | POA: Diagnosis not present

## 2023-04-27 DIAGNOSIS — I700 Atherosclerosis of aorta: Secondary | ICD-10-CM | POA: Diagnosis not present

## 2023-04-27 DIAGNOSIS — G40409 Other generalized epilepsy and epileptic syndromes, not intractable, without status epilepticus: Secondary | ICD-10-CM | POA: Diagnosis not present

## 2023-04-27 DIAGNOSIS — I129 Hypertensive chronic kidney disease with stage 1 through stage 4 chronic kidney disease, or unspecified chronic kidney disease: Secondary | ICD-10-CM | POA: Diagnosis not present

## 2023-04-27 DIAGNOSIS — R310 Gross hematuria: Secondary | ICD-10-CM | POA: Diagnosis not present

## 2023-04-27 DIAGNOSIS — E1122 Type 2 diabetes mellitus with diabetic chronic kidney disease: Secondary | ICD-10-CM | POA: Diagnosis not present

## 2023-04-27 DIAGNOSIS — N184 Chronic kidney disease, stage 4 (severe): Secondary | ICD-10-CM | POA: Diagnosis not present

## 2023-04-29 DIAGNOSIS — G40409 Other generalized epilepsy and epileptic syndromes, not intractable, without status epilepticus: Secondary | ICD-10-CM | POA: Diagnosis not present

## 2023-04-29 DIAGNOSIS — E538 Deficiency of other specified B group vitamins: Secondary | ICD-10-CM | POA: Diagnosis not present

## 2023-04-29 DIAGNOSIS — E1122 Type 2 diabetes mellitus with diabetic chronic kidney disease: Secondary | ICD-10-CM | POA: Diagnosis not present

## 2023-04-29 DIAGNOSIS — I129 Hypertensive chronic kidney disease with stage 1 through stage 4 chronic kidney disease, or unspecified chronic kidney disease: Secondary | ICD-10-CM | POA: Diagnosis not present

## 2023-04-29 DIAGNOSIS — N184 Chronic kidney disease, stage 4 (severe): Secondary | ICD-10-CM | POA: Diagnosis not present

## 2023-04-29 DIAGNOSIS — I700 Atherosclerosis of aorta: Secondary | ICD-10-CM | POA: Diagnosis not present

## 2023-05-04 DIAGNOSIS — G40409 Other generalized epilepsy and epileptic syndromes, not intractable, without status epilepticus: Secondary | ICD-10-CM | POA: Diagnosis not present

## 2023-05-04 DIAGNOSIS — E1122 Type 2 diabetes mellitus with diabetic chronic kidney disease: Secondary | ICD-10-CM | POA: Diagnosis not present

## 2023-05-04 DIAGNOSIS — I700 Atherosclerosis of aorta: Secondary | ICD-10-CM | POA: Diagnosis not present

## 2023-05-04 DIAGNOSIS — E538 Deficiency of other specified B group vitamins: Secondary | ICD-10-CM | POA: Diagnosis not present

## 2023-05-04 DIAGNOSIS — N184 Chronic kidney disease, stage 4 (severe): Secondary | ICD-10-CM | POA: Diagnosis not present

## 2023-05-04 DIAGNOSIS — I129 Hypertensive chronic kidney disease with stage 1 through stage 4 chronic kidney disease, or unspecified chronic kidney disease: Secondary | ICD-10-CM | POA: Diagnosis not present

## 2023-05-06 DIAGNOSIS — E1122 Type 2 diabetes mellitus with diabetic chronic kidney disease: Secondary | ICD-10-CM | POA: Diagnosis not present

## 2023-05-06 DIAGNOSIS — N184 Chronic kidney disease, stage 4 (severe): Secondary | ICD-10-CM | POA: Diagnosis not present

## 2023-05-06 DIAGNOSIS — I700 Atherosclerosis of aorta: Secondary | ICD-10-CM | POA: Diagnosis not present

## 2023-05-06 DIAGNOSIS — I129 Hypertensive chronic kidney disease with stage 1 through stage 4 chronic kidney disease, or unspecified chronic kidney disease: Secondary | ICD-10-CM | POA: Diagnosis not present

## 2023-05-06 DIAGNOSIS — E538 Deficiency of other specified B group vitamins: Secondary | ICD-10-CM | POA: Diagnosis not present

## 2023-05-06 DIAGNOSIS — G40409 Other generalized epilepsy and epileptic syndromes, not intractable, without status epilepticus: Secondary | ICD-10-CM | POA: Diagnosis not present

## 2023-05-12 DIAGNOSIS — E1122 Type 2 diabetes mellitus with diabetic chronic kidney disease: Secondary | ICD-10-CM | POA: Diagnosis not present

## 2023-05-12 DIAGNOSIS — N184 Chronic kidney disease, stage 4 (severe): Secondary | ICD-10-CM | POA: Diagnosis not present

## 2023-05-12 DIAGNOSIS — I129 Hypertensive chronic kidney disease with stage 1 through stage 4 chronic kidney disease, or unspecified chronic kidney disease: Secondary | ICD-10-CM | POA: Diagnosis not present

## 2023-05-12 DIAGNOSIS — G40409 Other generalized epilepsy and epileptic syndromes, not intractable, without status epilepticus: Secondary | ICD-10-CM | POA: Diagnosis not present

## 2023-05-12 DIAGNOSIS — I700 Atherosclerosis of aorta: Secondary | ICD-10-CM | POA: Diagnosis not present

## 2023-05-12 DIAGNOSIS — E538 Deficiency of other specified B group vitamins: Secondary | ICD-10-CM | POA: Diagnosis not present

## 2023-05-20 DIAGNOSIS — N184 Chronic kidney disease, stage 4 (severe): Secondary | ICD-10-CM | POA: Diagnosis not present

## 2023-05-20 DIAGNOSIS — E1122 Type 2 diabetes mellitus with diabetic chronic kidney disease: Secondary | ICD-10-CM | POA: Diagnosis not present

## 2023-05-20 DIAGNOSIS — I129 Hypertensive chronic kidney disease with stage 1 through stage 4 chronic kidney disease, or unspecified chronic kidney disease: Secondary | ICD-10-CM | POA: Diagnosis not present

## 2023-05-20 DIAGNOSIS — G40409 Other generalized epilepsy and epileptic syndromes, not intractable, without status epilepticus: Secondary | ICD-10-CM | POA: Diagnosis not present

## 2023-05-20 DIAGNOSIS — E538 Deficiency of other specified B group vitamins: Secondary | ICD-10-CM | POA: Diagnosis not present

## 2023-05-20 DIAGNOSIS — I700 Atherosclerosis of aorta: Secondary | ICD-10-CM | POA: Diagnosis not present

## 2023-06-18 DIAGNOSIS — F01B0 Vascular dementia, moderate, without behavioral disturbance, psychotic disturbance, mood disturbance, and anxiety: Secondary | ICD-10-CM | POA: Diagnosis not present

## 2023-06-18 DIAGNOSIS — E538 Deficiency of other specified B group vitamins: Secondary | ICD-10-CM | POA: Diagnosis not present

## 2023-06-18 DIAGNOSIS — E785 Hyperlipidemia, unspecified: Secondary | ICD-10-CM | POA: Diagnosis not present

## 2023-06-18 DIAGNOSIS — R300 Dysuria: Secondary | ICD-10-CM | POA: Diagnosis not present

## 2023-06-18 DIAGNOSIS — R29898 Other symptoms and signs involving the musculoskeletal system: Secondary | ICD-10-CM | POA: Diagnosis not present

## 2023-06-18 DIAGNOSIS — E1121 Type 2 diabetes mellitus with diabetic nephropathy: Secondary | ICD-10-CM | POA: Diagnosis not present

## 2023-06-18 DIAGNOSIS — R0602 Shortness of breath: Secondary | ICD-10-CM | POA: Diagnosis not present

## 2023-06-18 DIAGNOSIS — M6281 Muscle weakness (generalized): Secondary | ICD-10-CM | POA: Diagnosis not present

## 2023-06-19 DIAGNOSIS — R300 Dysuria: Secondary | ICD-10-CM | POA: Diagnosis not present

## 2023-06-19 DIAGNOSIS — E1121 Type 2 diabetes mellitus with diabetic nephropathy: Secondary | ICD-10-CM | POA: Diagnosis not present

## 2023-06-22 DIAGNOSIS — I672 Cerebral atherosclerosis: Secondary | ICD-10-CM | POA: Diagnosis not present

## 2023-06-22 DIAGNOSIS — Z556 Problems related to health literacy: Secondary | ICD-10-CM | POA: Diagnosis not present

## 2023-06-22 DIAGNOSIS — R300 Dysuria: Secondary | ICD-10-CM | POA: Diagnosis not present

## 2023-06-22 DIAGNOSIS — I129 Hypertensive chronic kidney disease with stage 1 through stage 4 chronic kidney disease, or unspecified chronic kidney disease: Secondary | ICD-10-CM | POA: Diagnosis not present

## 2023-06-22 DIAGNOSIS — E039 Hypothyroidism, unspecified: Secondary | ICD-10-CM | POA: Diagnosis not present

## 2023-06-22 DIAGNOSIS — E1122 Type 2 diabetes mellitus with diabetic chronic kidney disease: Secondary | ICD-10-CM | POA: Diagnosis not present

## 2023-06-22 DIAGNOSIS — N184 Chronic kidney disease, stage 4 (severe): Secondary | ICD-10-CM | POA: Diagnosis not present

## 2023-06-22 DIAGNOSIS — M810 Age-related osteoporosis without current pathological fracture: Secondary | ICD-10-CM | POA: Diagnosis not present

## 2023-06-22 DIAGNOSIS — E1140 Type 2 diabetes mellitus with diabetic neuropathy, unspecified: Secondary | ICD-10-CM | POA: Diagnosis not present

## 2023-06-22 DIAGNOSIS — I081 Rheumatic disorders of both mitral and tricuspid valves: Secondary | ICD-10-CM | POA: Diagnosis not present

## 2023-06-22 DIAGNOSIS — Z7984 Long term (current) use of oral hypoglycemic drugs: Secondary | ICD-10-CM | POA: Diagnosis not present

## 2023-06-22 DIAGNOSIS — E663 Overweight: Secondary | ICD-10-CM | POA: Diagnosis not present

## 2023-06-22 DIAGNOSIS — F01B18 Vascular dementia, moderate, with other behavioral disturbance: Secondary | ICD-10-CM | POA: Diagnosis not present

## 2023-06-22 DIAGNOSIS — G40409 Other generalized epilepsy and epileptic syndromes, not intractable, without status epilepticus: Secondary | ICD-10-CM | POA: Diagnosis not present

## 2023-06-22 DIAGNOSIS — I700 Atherosclerosis of aorta: Secondary | ICD-10-CM | POA: Diagnosis not present

## 2023-06-22 DIAGNOSIS — Z853 Personal history of malignant neoplasm of breast: Secondary | ICD-10-CM | POA: Diagnosis not present

## 2023-06-22 DIAGNOSIS — E538 Deficiency of other specified B group vitamins: Secondary | ICD-10-CM | POA: Diagnosis not present

## 2023-06-22 DIAGNOSIS — Z6827 Body mass index (BMI) 27.0-27.9, adult: Secondary | ICD-10-CM | POA: Diagnosis not present

## 2023-06-22 DIAGNOSIS — Z79899 Other long term (current) drug therapy: Secondary | ICD-10-CM | POA: Diagnosis not present

## 2023-06-22 DIAGNOSIS — J410 Simple chronic bronchitis: Secondary | ICD-10-CM | POA: Diagnosis not present

## 2023-06-22 DIAGNOSIS — G4733 Obstructive sleep apnea (adult) (pediatric): Secondary | ICD-10-CM | POA: Diagnosis not present

## 2023-06-22 DIAGNOSIS — I2510 Atherosclerotic heart disease of native coronary artery without angina pectoris: Secondary | ICD-10-CM | POA: Diagnosis not present

## 2023-06-22 DIAGNOSIS — E785 Hyperlipidemia, unspecified: Secondary | ICD-10-CM | POA: Diagnosis not present

## 2023-06-25 DIAGNOSIS — I129 Hypertensive chronic kidney disease with stage 1 through stage 4 chronic kidney disease, or unspecified chronic kidney disease: Secondary | ICD-10-CM | POA: Diagnosis not present

## 2023-06-25 DIAGNOSIS — E1122 Type 2 diabetes mellitus with diabetic chronic kidney disease: Secondary | ICD-10-CM | POA: Diagnosis not present

## 2023-06-25 DIAGNOSIS — E1140 Type 2 diabetes mellitus with diabetic neuropathy, unspecified: Secondary | ICD-10-CM | POA: Diagnosis not present

## 2023-06-25 DIAGNOSIS — E538 Deficiency of other specified B group vitamins: Secondary | ICD-10-CM | POA: Diagnosis not present

## 2023-06-25 DIAGNOSIS — N184 Chronic kidney disease, stage 4 (severe): Secondary | ICD-10-CM | POA: Diagnosis not present

## 2023-06-25 DIAGNOSIS — G40409 Other generalized epilepsy and epileptic syndromes, not intractable, without status epilepticus: Secondary | ICD-10-CM | POA: Diagnosis not present

## 2023-06-26 DIAGNOSIS — E538 Deficiency of other specified B group vitamins: Secondary | ICD-10-CM | POA: Diagnosis not present

## 2023-06-26 DIAGNOSIS — E1122 Type 2 diabetes mellitus with diabetic chronic kidney disease: Secondary | ICD-10-CM | POA: Diagnosis not present

## 2023-06-26 DIAGNOSIS — N184 Chronic kidney disease, stage 4 (severe): Secondary | ICD-10-CM | POA: Diagnosis not present

## 2023-06-26 DIAGNOSIS — G40409 Other generalized epilepsy and epileptic syndromes, not intractable, without status epilepticus: Secondary | ICD-10-CM | POA: Diagnosis not present

## 2023-06-26 DIAGNOSIS — E1140 Type 2 diabetes mellitus with diabetic neuropathy, unspecified: Secondary | ICD-10-CM | POA: Diagnosis not present

## 2023-06-26 DIAGNOSIS — I129 Hypertensive chronic kidney disease with stage 1 through stage 4 chronic kidney disease, or unspecified chronic kidney disease: Secondary | ICD-10-CM | POA: Diagnosis not present

## 2023-06-29 DIAGNOSIS — N184 Chronic kidney disease, stage 4 (severe): Secondary | ICD-10-CM | POA: Diagnosis not present

## 2023-06-29 DIAGNOSIS — E1122 Type 2 diabetes mellitus with diabetic chronic kidney disease: Secondary | ICD-10-CM | POA: Diagnosis not present

## 2023-06-29 DIAGNOSIS — I129 Hypertensive chronic kidney disease with stage 1 through stage 4 chronic kidney disease, or unspecified chronic kidney disease: Secondary | ICD-10-CM | POA: Diagnosis not present

## 2023-06-29 DIAGNOSIS — G40409 Other generalized epilepsy and epileptic syndromes, not intractable, without status epilepticus: Secondary | ICD-10-CM | POA: Diagnosis not present

## 2023-06-29 DIAGNOSIS — E1140 Type 2 diabetes mellitus with diabetic neuropathy, unspecified: Secondary | ICD-10-CM | POA: Diagnosis not present

## 2023-06-29 DIAGNOSIS — E538 Deficiency of other specified B group vitamins: Secondary | ICD-10-CM | POA: Diagnosis not present

## 2023-06-30 DIAGNOSIS — I129 Hypertensive chronic kidney disease with stage 1 through stage 4 chronic kidney disease, or unspecified chronic kidney disease: Secondary | ICD-10-CM | POA: Diagnosis not present

## 2023-06-30 DIAGNOSIS — E1140 Type 2 diabetes mellitus with diabetic neuropathy, unspecified: Secondary | ICD-10-CM | POA: Diagnosis not present

## 2023-07-07 DIAGNOSIS — I129 Hypertensive chronic kidney disease with stage 1 through stage 4 chronic kidney disease, or unspecified chronic kidney disease: Secondary | ICD-10-CM | POA: Diagnosis not present

## 2023-07-07 DIAGNOSIS — N184 Chronic kidney disease, stage 4 (severe): Secondary | ICD-10-CM | POA: Diagnosis not present

## 2023-07-07 DIAGNOSIS — G40409 Other generalized epilepsy and epileptic syndromes, not intractable, without status epilepticus: Secondary | ICD-10-CM | POA: Diagnosis not present

## 2023-07-07 DIAGNOSIS — E1122 Type 2 diabetes mellitus with diabetic chronic kidney disease: Secondary | ICD-10-CM | POA: Diagnosis not present

## 2023-07-07 DIAGNOSIS — E1140 Type 2 diabetes mellitus with diabetic neuropathy, unspecified: Secondary | ICD-10-CM | POA: Diagnosis not present

## 2023-07-07 DIAGNOSIS — E538 Deficiency of other specified B group vitamins: Secondary | ICD-10-CM | POA: Diagnosis not present

## 2023-07-08 DIAGNOSIS — G40409 Other generalized epilepsy and epileptic syndromes, not intractable, without status epilepticus: Secondary | ICD-10-CM | POA: Diagnosis not present

## 2023-07-08 DIAGNOSIS — N184 Chronic kidney disease, stage 4 (severe): Secondary | ICD-10-CM | POA: Diagnosis not present

## 2023-07-08 DIAGNOSIS — I129 Hypertensive chronic kidney disease with stage 1 through stage 4 chronic kidney disease, or unspecified chronic kidney disease: Secondary | ICD-10-CM | POA: Diagnosis not present

## 2023-07-08 DIAGNOSIS — E1140 Type 2 diabetes mellitus with diabetic neuropathy, unspecified: Secondary | ICD-10-CM | POA: Diagnosis not present

## 2023-07-08 DIAGNOSIS — E538 Deficiency of other specified B group vitamins: Secondary | ICD-10-CM | POA: Diagnosis not present

## 2023-07-08 DIAGNOSIS — E1122 Type 2 diabetes mellitus with diabetic chronic kidney disease: Secondary | ICD-10-CM | POA: Diagnosis not present

## 2023-07-10 DIAGNOSIS — N184 Chronic kidney disease, stage 4 (severe): Secondary | ICD-10-CM | POA: Diagnosis not present

## 2023-07-10 DIAGNOSIS — E1140 Type 2 diabetes mellitus with diabetic neuropathy, unspecified: Secondary | ICD-10-CM | POA: Diagnosis not present

## 2023-07-10 DIAGNOSIS — I129 Hypertensive chronic kidney disease with stage 1 through stage 4 chronic kidney disease, or unspecified chronic kidney disease: Secondary | ICD-10-CM | POA: Diagnosis not present

## 2023-07-10 DIAGNOSIS — E538 Deficiency of other specified B group vitamins: Secondary | ICD-10-CM | POA: Diagnosis not present

## 2023-07-10 DIAGNOSIS — E1122 Type 2 diabetes mellitus with diabetic chronic kidney disease: Secondary | ICD-10-CM | POA: Diagnosis not present

## 2023-07-10 DIAGNOSIS — G40409 Other generalized epilepsy and epileptic syndromes, not intractable, without status epilepticus: Secondary | ICD-10-CM | POA: Diagnosis not present

## 2023-07-14 DIAGNOSIS — E1140 Type 2 diabetes mellitus with diabetic neuropathy, unspecified: Secondary | ICD-10-CM | POA: Diagnosis not present

## 2023-07-14 DIAGNOSIS — E1122 Type 2 diabetes mellitus with diabetic chronic kidney disease: Secondary | ICD-10-CM | POA: Diagnosis not present

## 2023-07-14 DIAGNOSIS — E538 Deficiency of other specified B group vitamins: Secondary | ICD-10-CM | POA: Diagnosis not present

## 2023-07-14 DIAGNOSIS — I129 Hypertensive chronic kidney disease with stage 1 through stage 4 chronic kidney disease, or unspecified chronic kidney disease: Secondary | ICD-10-CM | POA: Diagnosis not present

## 2023-07-14 DIAGNOSIS — G40409 Other generalized epilepsy and epileptic syndromes, not intractable, without status epilepticus: Secondary | ICD-10-CM | POA: Diagnosis not present

## 2023-07-14 DIAGNOSIS — N184 Chronic kidney disease, stage 4 (severe): Secondary | ICD-10-CM | POA: Diagnosis not present

## 2023-07-17 DIAGNOSIS — E538 Deficiency of other specified B group vitamins: Secondary | ICD-10-CM | POA: Diagnosis not present

## 2023-07-17 DIAGNOSIS — E1122 Type 2 diabetes mellitus with diabetic chronic kidney disease: Secondary | ICD-10-CM | POA: Diagnosis not present

## 2023-07-17 DIAGNOSIS — I129 Hypertensive chronic kidney disease with stage 1 through stage 4 chronic kidney disease, or unspecified chronic kidney disease: Secondary | ICD-10-CM | POA: Diagnosis not present

## 2023-07-17 DIAGNOSIS — N184 Chronic kidney disease, stage 4 (severe): Secondary | ICD-10-CM | POA: Diagnosis not present

## 2023-07-17 DIAGNOSIS — G40409 Other generalized epilepsy and epileptic syndromes, not intractable, without status epilepticus: Secondary | ICD-10-CM | POA: Diagnosis not present

## 2023-07-17 DIAGNOSIS — E1140 Type 2 diabetes mellitus with diabetic neuropathy, unspecified: Secondary | ICD-10-CM | POA: Diagnosis not present

## 2023-07-22 DIAGNOSIS — E538 Deficiency of other specified B group vitamins: Secondary | ICD-10-CM | POA: Diagnosis not present

## 2023-07-22 DIAGNOSIS — Z853 Personal history of malignant neoplasm of breast: Secondary | ICD-10-CM | POA: Diagnosis not present

## 2023-07-22 DIAGNOSIS — J410 Simple chronic bronchitis: Secondary | ICD-10-CM | POA: Diagnosis not present

## 2023-07-22 DIAGNOSIS — E039 Hypothyroidism, unspecified: Secondary | ICD-10-CM | POA: Diagnosis not present

## 2023-07-22 DIAGNOSIS — I129 Hypertensive chronic kidney disease with stage 1 through stage 4 chronic kidney disease, or unspecified chronic kidney disease: Secondary | ICD-10-CM | POA: Diagnosis not present

## 2023-07-22 DIAGNOSIS — I672 Cerebral atherosclerosis: Secondary | ICD-10-CM | POA: Diagnosis not present

## 2023-07-22 DIAGNOSIS — Z7984 Long term (current) use of oral hypoglycemic drugs: Secondary | ICD-10-CM | POA: Diagnosis not present

## 2023-07-22 DIAGNOSIS — F01B18 Vascular dementia, moderate, with other behavioral disturbance: Secondary | ICD-10-CM | POA: Diagnosis not present

## 2023-07-22 DIAGNOSIS — E785 Hyperlipidemia, unspecified: Secondary | ICD-10-CM | POA: Diagnosis not present

## 2023-07-22 DIAGNOSIS — N184 Chronic kidney disease, stage 4 (severe): Secondary | ICD-10-CM | POA: Diagnosis not present

## 2023-07-22 DIAGNOSIS — Z79899 Other long term (current) drug therapy: Secondary | ICD-10-CM | POA: Diagnosis not present

## 2023-07-22 DIAGNOSIS — Z556 Problems related to health literacy: Secondary | ICD-10-CM | POA: Diagnosis not present

## 2023-07-22 DIAGNOSIS — Z6827 Body mass index (BMI) 27.0-27.9, adult: Secondary | ICD-10-CM | POA: Diagnosis not present

## 2023-07-22 DIAGNOSIS — E1140 Type 2 diabetes mellitus with diabetic neuropathy, unspecified: Secondary | ICD-10-CM | POA: Diagnosis not present

## 2023-07-22 DIAGNOSIS — M810 Age-related osteoporosis without current pathological fracture: Secondary | ICD-10-CM | POA: Diagnosis not present

## 2023-07-22 DIAGNOSIS — I700 Atherosclerosis of aorta: Secondary | ICD-10-CM | POA: Diagnosis not present

## 2023-07-22 DIAGNOSIS — I2510 Atherosclerotic heart disease of native coronary artery without angina pectoris: Secondary | ICD-10-CM | POA: Diagnosis not present

## 2023-07-22 DIAGNOSIS — G40409 Other generalized epilepsy and epileptic syndromes, not intractable, without status epilepticus: Secondary | ICD-10-CM | POA: Diagnosis not present

## 2023-07-22 DIAGNOSIS — I081 Rheumatic disorders of both mitral and tricuspid valves: Secondary | ICD-10-CM | POA: Diagnosis not present

## 2023-07-22 DIAGNOSIS — G4733 Obstructive sleep apnea (adult) (pediatric): Secondary | ICD-10-CM | POA: Diagnosis not present

## 2023-07-22 DIAGNOSIS — R300 Dysuria: Secondary | ICD-10-CM | POA: Diagnosis not present

## 2023-07-22 DIAGNOSIS — E663 Overweight: Secondary | ICD-10-CM | POA: Diagnosis not present

## 2023-07-22 DIAGNOSIS — E1122 Type 2 diabetes mellitus with diabetic chronic kidney disease: Secondary | ICD-10-CM | POA: Diagnosis not present

## 2023-07-23 DIAGNOSIS — E1122 Type 2 diabetes mellitus with diabetic chronic kidney disease: Secondary | ICD-10-CM | POA: Diagnosis not present

## 2023-07-23 DIAGNOSIS — E538 Deficiency of other specified B group vitamins: Secondary | ICD-10-CM | POA: Diagnosis not present

## 2023-07-23 DIAGNOSIS — I129 Hypertensive chronic kidney disease with stage 1 through stage 4 chronic kidney disease, or unspecified chronic kidney disease: Secondary | ICD-10-CM | POA: Diagnosis not present

## 2023-07-23 DIAGNOSIS — E1140 Type 2 diabetes mellitus with diabetic neuropathy, unspecified: Secondary | ICD-10-CM | POA: Diagnosis not present

## 2023-07-23 DIAGNOSIS — G40409 Other generalized epilepsy and epileptic syndromes, not intractable, without status epilepticus: Secondary | ICD-10-CM | POA: Diagnosis not present

## 2023-07-23 DIAGNOSIS — N184 Chronic kidney disease, stage 4 (severe): Secondary | ICD-10-CM | POA: Diagnosis not present

## 2023-07-27 DIAGNOSIS — E538 Deficiency of other specified B group vitamins: Secondary | ICD-10-CM | POA: Diagnosis not present

## 2023-07-27 DIAGNOSIS — G40409 Other generalized epilepsy and epileptic syndromes, not intractable, without status epilepticus: Secondary | ICD-10-CM | POA: Diagnosis not present

## 2023-07-27 DIAGNOSIS — I129 Hypertensive chronic kidney disease with stage 1 through stage 4 chronic kidney disease, or unspecified chronic kidney disease: Secondary | ICD-10-CM | POA: Diagnosis not present

## 2023-07-27 DIAGNOSIS — E1122 Type 2 diabetes mellitus with diabetic chronic kidney disease: Secondary | ICD-10-CM | POA: Diagnosis not present

## 2023-07-27 DIAGNOSIS — E1140 Type 2 diabetes mellitus with diabetic neuropathy, unspecified: Secondary | ICD-10-CM | POA: Diagnosis not present

## 2023-07-27 DIAGNOSIS — N184 Chronic kidney disease, stage 4 (severe): Secondary | ICD-10-CM | POA: Diagnosis not present

## 2023-08-03 DIAGNOSIS — N184 Chronic kidney disease, stage 4 (severe): Secondary | ICD-10-CM | POA: Diagnosis not present

## 2023-08-03 DIAGNOSIS — G40409 Other generalized epilepsy and epileptic syndromes, not intractable, without status epilepticus: Secondary | ICD-10-CM | POA: Diagnosis not present

## 2023-08-03 DIAGNOSIS — E538 Deficiency of other specified B group vitamins: Secondary | ICD-10-CM | POA: Diagnosis not present

## 2023-08-03 DIAGNOSIS — I129 Hypertensive chronic kidney disease with stage 1 through stage 4 chronic kidney disease, or unspecified chronic kidney disease: Secondary | ICD-10-CM | POA: Diagnosis not present

## 2023-08-03 DIAGNOSIS — E1122 Type 2 diabetes mellitus with diabetic chronic kidney disease: Secondary | ICD-10-CM | POA: Diagnosis not present

## 2023-08-03 DIAGNOSIS — E1140 Type 2 diabetes mellitus with diabetic neuropathy, unspecified: Secondary | ICD-10-CM | POA: Diagnosis not present

## 2023-08-25 DIAGNOSIS — R310 Gross hematuria: Secondary | ICD-10-CM | POA: Diagnosis not present

## 2023-08-25 DIAGNOSIS — N952 Postmenopausal atrophic vaginitis: Secondary | ICD-10-CM | POA: Diagnosis not present

## 2023-08-25 DIAGNOSIS — N390 Urinary tract infection, site not specified: Secondary | ICD-10-CM | POA: Diagnosis not present

## 2023-08-28 DIAGNOSIS — E538 Deficiency of other specified B group vitamins: Secondary | ICD-10-CM | POA: Diagnosis not present

## 2023-09-09 DIAGNOSIS — I672 Cerebral atherosclerosis: Secondary | ICD-10-CM | POA: Diagnosis not present

## 2023-09-09 DIAGNOSIS — M6281 Muscle weakness (generalized): Secondary | ICD-10-CM | POA: Diagnosis not present

## 2023-09-09 DIAGNOSIS — F01B0 Vascular dementia, moderate, without behavioral disturbance, psychotic disturbance, mood disturbance, and anxiety: Secondary | ICD-10-CM | POA: Diagnosis not present

## 2023-09-09 DIAGNOSIS — E1121 Type 2 diabetes mellitus with diabetic nephropathy: Secondary | ICD-10-CM | POA: Diagnosis not present

## 2023-10-19 DIAGNOSIS — N390 Urinary tract infection, site not specified: Secondary | ICD-10-CM | POA: Diagnosis not present

## 2023-10-19 DIAGNOSIS — N281 Cyst of kidney, acquired: Secondary | ICD-10-CM | POA: Diagnosis not present

## 2023-10-19 DIAGNOSIS — N2889 Other specified disorders of kidney and ureter: Secondary | ICD-10-CM | POA: Diagnosis not present

## 2023-10-19 DIAGNOSIS — E860 Dehydration: Secondary | ICD-10-CM | POA: Diagnosis not present

## 2023-10-22 DIAGNOSIS — R339 Retention of urine, unspecified: Secondary | ICD-10-CM | POA: Diagnosis not present

## 2023-10-26 DIAGNOSIS — Z1231 Encounter for screening mammogram for malignant neoplasm of breast: Secondary | ICD-10-CM | POA: Diagnosis not present

## 2023-10-27 DIAGNOSIS — J410 Simple chronic bronchitis: Secondary | ICD-10-CM | POA: Diagnosis not present

## 2023-10-27 DIAGNOSIS — J441 Chronic obstructive pulmonary disease with (acute) exacerbation: Secondary | ICD-10-CM | POA: Diagnosis not present

## 2023-10-27 DIAGNOSIS — R051 Acute cough: Secondary | ICD-10-CM | POA: Diagnosis not present

## 2023-10-27 DIAGNOSIS — I10 Essential (primary) hypertension: Secondary | ICD-10-CM | POA: Diagnosis not present

## 2023-11-02 DIAGNOSIS — M810 Age-related osteoporosis without current pathological fracture: Secondary | ICD-10-CM | POA: Diagnosis not present

## 2023-11-02 DIAGNOSIS — F0390 Unspecified dementia without behavioral disturbance: Secondary | ICD-10-CM | POA: Diagnosis not present

## 2023-11-02 DIAGNOSIS — E7800 Pure hypercholesterolemia, unspecified: Secondary | ICD-10-CM | POA: Diagnosis not present

## 2023-11-02 DIAGNOSIS — R4182 Altered mental status, unspecified: Secondary | ICD-10-CM | POA: Diagnosis not present

## 2023-11-02 DIAGNOSIS — Z853 Personal history of malignant neoplasm of breast: Secondary | ICD-10-CM | POA: Diagnosis not present

## 2023-11-02 DIAGNOSIS — Z87440 Personal history of urinary (tract) infections: Secondary | ICD-10-CM | POA: Diagnosis not present

## 2023-11-02 DIAGNOSIS — Z8701 Personal history of pneumonia (recurrent): Secondary | ICD-10-CM | POA: Diagnosis not present

## 2023-11-02 DIAGNOSIS — R531 Weakness: Secondary | ICD-10-CM | POA: Diagnosis not present

## 2023-11-02 DIAGNOSIS — D649 Anemia, unspecified: Secondary | ICD-10-CM | POA: Diagnosis not present

## 2023-11-02 DIAGNOSIS — I63443 Cerebral infarction due to embolism of bilateral cerebellar arteries: Secondary | ICD-10-CM | POA: Diagnosis present

## 2023-11-02 DIAGNOSIS — Z7982 Long term (current) use of aspirin: Secondary | ICD-10-CM | POA: Diagnosis not present

## 2023-11-02 DIAGNOSIS — N184 Chronic kidney disease, stage 4 (severe): Secondary | ICD-10-CM | POA: Diagnosis present

## 2023-11-02 DIAGNOSIS — Z87442 Personal history of urinary calculi: Secondary | ICD-10-CM | POA: Diagnosis not present

## 2023-11-02 DIAGNOSIS — R29818 Other symptoms and signs involving the nervous system: Secondary | ICD-10-CM | POA: Diagnosis not present

## 2023-11-02 DIAGNOSIS — M1990 Unspecified osteoarthritis, unspecified site: Secondary | ICD-10-CM | POA: Diagnosis not present

## 2023-11-02 DIAGNOSIS — Z888 Allergy status to other drugs, medicaments and biological substances status: Secondary | ICD-10-CM | POA: Diagnosis not present

## 2023-11-02 DIAGNOSIS — I639 Cerebral infarction, unspecified: Secondary | ICD-10-CM | POA: Diagnosis not present

## 2023-11-02 DIAGNOSIS — R9431 Abnormal electrocardiogram [ECG] [EKG]: Secondary | ICD-10-CM | POA: Diagnosis not present

## 2023-11-02 DIAGNOSIS — E1122 Type 2 diabetes mellitus with diabetic chronic kidney disease: Secondary | ICD-10-CM | POA: Diagnosis not present

## 2023-11-02 DIAGNOSIS — G8321 Monoplegia of upper limb affecting right dominant side: Secondary | ICD-10-CM | POA: Diagnosis not present

## 2023-11-02 DIAGNOSIS — R29702 NIHSS score 2: Secondary | ICD-10-CM | POA: Diagnosis not present

## 2023-11-02 DIAGNOSIS — I129 Hypertensive chronic kidney disease with stage 1 through stage 4 chronic kidney disease, or unspecified chronic kidney disease: Secondary | ICD-10-CM | POA: Diagnosis not present

## 2023-11-02 DIAGNOSIS — Z66 Do not resuscitate: Secondary | ICD-10-CM | POA: Diagnosis not present

## 2023-11-02 DIAGNOSIS — G459 Transient cerebral ischemic attack, unspecified: Secondary | ICD-10-CM | POA: Diagnosis not present

## 2023-11-02 DIAGNOSIS — E119 Type 2 diabetes mellitus without complications: Secondary | ICD-10-CM | POA: Diagnosis not present

## 2023-11-02 DIAGNOSIS — I10 Essential (primary) hypertension: Secondary | ICD-10-CM | POA: Diagnosis not present

## 2023-11-02 DIAGNOSIS — E039 Hypothyroidism, unspecified: Secondary | ICD-10-CM | POA: Diagnosis not present

## 2023-11-02 DIAGNOSIS — Z79899 Other long term (current) drug therapy: Secondary | ICD-10-CM | POA: Diagnosis not present

## 2023-11-02 DIAGNOSIS — Z7902 Long term (current) use of antithrombotics/antiplatelets: Secondary | ICD-10-CM | POA: Diagnosis not present

## 2023-11-02 DIAGNOSIS — N179 Acute kidney failure, unspecified: Secondary | ICD-10-CM | POA: Diagnosis not present

## 2023-11-05 DIAGNOSIS — R059 Cough, unspecified: Secondary | ICD-10-CM | POA: Diagnosis not present

## 2023-11-05 DIAGNOSIS — Z6828 Body mass index (BMI) 28.0-28.9, adult: Secondary | ICD-10-CM | POA: Diagnosis not present

## 2023-11-06 DIAGNOSIS — I639 Cerebral infarction, unspecified: Secondary | ICD-10-CM | POA: Diagnosis not present

## 2023-11-09 DIAGNOSIS — Z170 Estrogen receptor positive status [ER+]: Secondary | ICD-10-CM | POA: Diagnosis not present

## 2023-11-09 DIAGNOSIS — C50211 Malignant neoplasm of upper-inner quadrant of right female breast: Secondary | ICD-10-CM | POA: Diagnosis not present

## 2023-11-10 DIAGNOSIS — I10 Essential (primary) hypertension: Secondary | ICD-10-CM | POA: Diagnosis not present

## 2023-11-10 DIAGNOSIS — Z79899 Other long term (current) drug therapy: Secondary | ICD-10-CM | POA: Diagnosis not present

## 2023-11-10 DIAGNOSIS — F0390 Unspecified dementia without behavioral disturbance: Secondary | ICD-10-CM | POA: Diagnosis not present

## 2023-11-10 DIAGNOSIS — E7800 Pure hypercholesterolemia, unspecified: Secondary | ICD-10-CM | POA: Diagnosis not present

## 2023-11-10 DIAGNOSIS — Z8673 Personal history of transient ischemic attack (TIA), and cerebral infarction without residual deficits: Secondary | ICD-10-CM | POA: Diagnosis not present

## 2023-11-10 DIAGNOSIS — R042 Hemoptysis: Secondary | ICD-10-CM | POA: Diagnosis not present

## 2023-11-11 DIAGNOSIS — I672 Cerebral atherosclerosis: Secondary | ICD-10-CM | POA: Diagnosis not present

## 2023-11-11 DIAGNOSIS — Z6828 Body mass index (BMI) 28.0-28.9, adult: Secondary | ICD-10-CM | POA: Diagnosis not present

## 2023-11-11 DIAGNOSIS — Z556 Problems related to health literacy: Secondary | ICD-10-CM | POA: Diagnosis not present

## 2023-11-11 DIAGNOSIS — Z8673 Personal history of transient ischemic attack (TIA), and cerebral infarction without residual deficits: Secondary | ICD-10-CM | POA: Diagnosis not present

## 2023-11-11 DIAGNOSIS — E663 Overweight: Secondary | ICD-10-CM | POA: Diagnosis not present

## 2023-11-11 DIAGNOSIS — Z7982 Long term (current) use of aspirin: Secondary | ICD-10-CM | POA: Diagnosis not present

## 2023-11-11 DIAGNOSIS — E7800 Pure hypercholesterolemia, unspecified: Secondary | ICD-10-CM | POA: Diagnosis not present

## 2023-11-11 DIAGNOSIS — J4489 Other specified chronic obstructive pulmonary disease: Secondary | ICD-10-CM | POA: Diagnosis not present

## 2023-11-11 DIAGNOSIS — Z8542 Personal history of malignant neoplasm of other parts of uterus: Secondary | ICD-10-CM | POA: Diagnosis not present

## 2023-11-11 DIAGNOSIS — Z853 Personal history of malignant neoplasm of breast: Secondary | ICD-10-CM | POA: Diagnosis not present

## 2023-11-11 DIAGNOSIS — E039 Hypothyroidism, unspecified: Secondary | ICD-10-CM | POA: Diagnosis not present

## 2023-11-11 DIAGNOSIS — F01B18 Vascular dementia, moderate, with other behavioral disturbance: Secondary | ICD-10-CM | POA: Diagnosis not present

## 2023-11-11 DIAGNOSIS — E1122 Type 2 diabetes mellitus with diabetic chronic kidney disease: Secondary | ICD-10-CM | POA: Diagnosis not present

## 2023-11-11 DIAGNOSIS — G40409 Other generalized epilepsy and epileptic syndromes, not intractable, without status epilepticus: Secondary | ICD-10-CM | POA: Diagnosis not present

## 2023-11-11 DIAGNOSIS — K5790 Diverticulosis of intestine, part unspecified, without perforation or abscess without bleeding: Secondary | ICD-10-CM | POA: Diagnosis not present

## 2023-11-11 DIAGNOSIS — G4733 Obstructive sleep apnea (adult) (pediatric): Secondary | ICD-10-CM | POA: Diagnosis not present

## 2023-11-11 DIAGNOSIS — E538 Deficiency of other specified B group vitamins: Secondary | ICD-10-CM | POA: Diagnosis not present

## 2023-11-11 DIAGNOSIS — N184 Chronic kidney disease, stage 4 (severe): Secondary | ICD-10-CM | POA: Diagnosis not present

## 2023-11-11 DIAGNOSIS — Z7984 Long term (current) use of oral hypoglycemic drugs: Secondary | ICD-10-CM | POA: Diagnosis not present

## 2023-11-11 DIAGNOSIS — M810 Age-related osteoporosis without current pathological fracture: Secondary | ICD-10-CM | POA: Diagnosis not present

## 2023-11-11 DIAGNOSIS — M1990 Unspecified osteoarthritis, unspecified site: Secondary | ICD-10-CM | POA: Diagnosis not present

## 2023-11-11 DIAGNOSIS — I129 Hypertensive chronic kidney disease with stage 1 through stage 4 chronic kidney disease, or unspecified chronic kidney disease: Secondary | ICD-10-CM | POA: Diagnosis not present

## 2023-11-11 DIAGNOSIS — Z87442 Personal history of urinary calculi: Secondary | ICD-10-CM | POA: Diagnosis not present

## 2023-11-12 DIAGNOSIS — E1122 Type 2 diabetes mellitus with diabetic chronic kidney disease: Secondary | ICD-10-CM | POA: Diagnosis not present

## 2023-11-12 DIAGNOSIS — I129 Hypertensive chronic kidney disease with stage 1 through stage 4 chronic kidney disease, or unspecified chronic kidney disease: Secondary | ICD-10-CM | POA: Diagnosis not present

## 2023-11-12 DIAGNOSIS — N184 Chronic kidney disease, stage 4 (severe): Secondary | ICD-10-CM | POA: Diagnosis not present

## 2023-11-12 DIAGNOSIS — F01B18 Vascular dementia, moderate, with other behavioral disturbance: Secondary | ICD-10-CM | POA: Diagnosis not present

## 2023-11-12 DIAGNOSIS — G40409 Other generalized epilepsy and epileptic syndromes, not intractable, without status epilepticus: Secondary | ICD-10-CM | POA: Diagnosis not present

## 2023-11-12 DIAGNOSIS — G4733 Obstructive sleep apnea (adult) (pediatric): Secondary | ICD-10-CM | POA: Diagnosis not present

## 2023-11-13 DIAGNOSIS — N184 Chronic kidney disease, stage 4 (severe): Secondary | ICD-10-CM | POA: Diagnosis not present

## 2023-11-13 DIAGNOSIS — E1122 Type 2 diabetes mellitus with diabetic chronic kidney disease: Secondary | ICD-10-CM | POA: Diagnosis not present

## 2023-11-13 DIAGNOSIS — I129 Hypertensive chronic kidney disease with stage 1 through stage 4 chronic kidney disease, or unspecified chronic kidney disease: Secondary | ICD-10-CM | POA: Diagnosis not present

## 2023-11-13 DIAGNOSIS — G40409 Other generalized epilepsy and epileptic syndromes, not intractable, without status epilepticus: Secondary | ICD-10-CM | POA: Diagnosis not present

## 2023-11-13 DIAGNOSIS — F01B18 Vascular dementia, moderate, with other behavioral disturbance: Secondary | ICD-10-CM | POA: Diagnosis not present

## 2023-11-13 DIAGNOSIS — G4733 Obstructive sleep apnea (adult) (pediatric): Secondary | ICD-10-CM | POA: Diagnosis not present

## 2023-11-16 DIAGNOSIS — I129 Hypertensive chronic kidney disease with stage 1 through stage 4 chronic kidney disease, or unspecified chronic kidney disease: Secondary | ICD-10-CM | POA: Diagnosis not present

## 2023-11-16 DIAGNOSIS — G4733 Obstructive sleep apnea (adult) (pediatric): Secondary | ICD-10-CM | POA: Diagnosis not present

## 2023-11-16 DIAGNOSIS — E1122 Type 2 diabetes mellitus with diabetic chronic kidney disease: Secondary | ICD-10-CM | POA: Diagnosis not present

## 2023-11-16 DIAGNOSIS — F01B18 Vascular dementia, moderate, with other behavioral disturbance: Secondary | ICD-10-CM | POA: Diagnosis not present

## 2023-11-16 DIAGNOSIS — N184 Chronic kidney disease, stage 4 (severe): Secondary | ICD-10-CM | POA: Diagnosis not present

## 2023-11-16 DIAGNOSIS — G40409 Other generalized epilepsy and epileptic syndromes, not intractable, without status epilepticus: Secondary | ICD-10-CM | POA: Diagnosis not present

## 2023-11-18 DIAGNOSIS — I672 Cerebral atherosclerosis: Secondary | ICD-10-CM | POA: Diagnosis not present

## 2023-11-18 DIAGNOSIS — J209 Acute bronchitis, unspecified: Secondary | ICD-10-CM | POA: Diagnosis not present

## 2023-11-18 DIAGNOSIS — N184 Chronic kidney disease, stage 4 (severe): Secondary | ICD-10-CM | POA: Diagnosis not present

## 2023-11-18 DIAGNOSIS — Z0000 Encounter for general adult medical examination without abnormal findings: Secondary | ICD-10-CM | POA: Diagnosis not present

## 2023-11-18 DIAGNOSIS — E1122 Type 2 diabetes mellitus with diabetic chronic kidney disease: Secondary | ICD-10-CM | POA: Diagnosis not present

## 2023-11-18 DIAGNOSIS — Z23 Encounter for immunization: Secondary | ICD-10-CM | POA: Diagnosis not present

## 2023-11-18 DIAGNOSIS — E039 Hypothyroidism, unspecified: Secondary | ICD-10-CM | POA: Diagnosis not present

## 2023-11-18 DIAGNOSIS — Z79899 Other long term (current) drug therapy: Secondary | ICD-10-CM | POA: Diagnosis not present

## 2023-11-18 DIAGNOSIS — E785 Hyperlipidemia, unspecified: Secondary | ICD-10-CM | POA: Diagnosis not present

## 2023-11-18 DIAGNOSIS — I69351 Hemiplegia and hemiparesis following cerebral infarction affecting right dominant side: Secondary | ICD-10-CM | POA: Diagnosis not present

## 2023-11-20 DIAGNOSIS — F01B18 Vascular dementia, moderate, with other behavioral disturbance: Secondary | ICD-10-CM | POA: Diagnosis not present

## 2023-11-20 DIAGNOSIS — E1122 Type 2 diabetes mellitus with diabetic chronic kidney disease: Secondary | ICD-10-CM | POA: Diagnosis not present

## 2023-11-20 DIAGNOSIS — G4733 Obstructive sleep apnea (adult) (pediatric): Secondary | ICD-10-CM | POA: Diagnosis not present

## 2023-11-20 DIAGNOSIS — N184 Chronic kidney disease, stage 4 (severe): Secondary | ICD-10-CM | POA: Diagnosis not present

## 2023-11-20 DIAGNOSIS — G40409 Other generalized epilepsy and epileptic syndromes, not intractable, without status epilepticus: Secondary | ICD-10-CM | POA: Diagnosis not present

## 2023-11-20 DIAGNOSIS — I639 Cerebral infarction, unspecified: Secondary | ICD-10-CM | POA: Diagnosis not present

## 2023-11-20 DIAGNOSIS — I129 Hypertensive chronic kidney disease with stage 1 through stage 4 chronic kidney disease, or unspecified chronic kidney disease: Secondary | ICD-10-CM | POA: Diagnosis not present

## 2023-11-24 DIAGNOSIS — R053 Chronic cough: Secondary | ICD-10-CM | POA: Diagnosis not present

## 2023-11-24 DIAGNOSIS — G40409 Other generalized epilepsy and epileptic syndromes, not intractable, without status epilepticus: Secondary | ICD-10-CM | POA: Diagnosis not present

## 2023-11-24 DIAGNOSIS — G4733 Obstructive sleep apnea (adult) (pediatric): Secondary | ICD-10-CM | POA: Diagnosis not present

## 2023-11-24 DIAGNOSIS — I129 Hypertensive chronic kidney disease with stage 1 through stage 4 chronic kidney disease, or unspecified chronic kidney disease: Secondary | ICD-10-CM | POA: Diagnosis not present

## 2023-11-24 DIAGNOSIS — E1122 Type 2 diabetes mellitus with diabetic chronic kidney disease: Secondary | ICD-10-CM | POA: Diagnosis not present

## 2023-11-24 DIAGNOSIS — N184 Chronic kidney disease, stage 4 (severe): Secondary | ICD-10-CM | POA: Diagnosis not present

## 2023-11-24 DIAGNOSIS — F01B18 Vascular dementia, moderate, with other behavioral disturbance: Secondary | ICD-10-CM | POA: Diagnosis not present

## 2023-11-24 DIAGNOSIS — R042 Hemoptysis: Secondary | ICD-10-CM | POA: Diagnosis not present

## 2023-11-26 DIAGNOSIS — N184 Chronic kidney disease, stage 4 (severe): Secondary | ICD-10-CM | POA: Diagnosis not present

## 2023-11-26 DIAGNOSIS — E1122 Type 2 diabetes mellitus with diabetic chronic kidney disease: Secondary | ICD-10-CM | POA: Diagnosis not present

## 2023-11-26 DIAGNOSIS — F01B18 Vascular dementia, moderate, with other behavioral disturbance: Secondary | ICD-10-CM | POA: Diagnosis not present

## 2023-11-26 DIAGNOSIS — I129 Hypertensive chronic kidney disease with stage 1 through stage 4 chronic kidney disease, or unspecified chronic kidney disease: Secondary | ICD-10-CM | POA: Diagnosis not present

## 2023-11-26 DIAGNOSIS — G4733 Obstructive sleep apnea (adult) (pediatric): Secondary | ICD-10-CM | POA: Diagnosis not present

## 2023-11-26 DIAGNOSIS — G40409 Other generalized epilepsy and epileptic syndromes, not intractable, without status epilepticus: Secondary | ICD-10-CM | POA: Diagnosis not present

## 2023-11-27 DIAGNOSIS — F01B18 Vascular dementia, moderate, with other behavioral disturbance: Secondary | ICD-10-CM | POA: Diagnosis not present

## 2023-11-27 DIAGNOSIS — N184 Chronic kidney disease, stage 4 (severe): Secondary | ICD-10-CM | POA: Diagnosis not present

## 2023-11-27 DIAGNOSIS — I129 Hypertensive chronic kidney disease with stage 1 through stage 4 chronic kidney disease, or unspecified chronic kidney disease: Secondary | ICD-10-CM | POA: Diagnosis not present

## 2023-11-27 DIAGNOSIS — G4733 Obstructive sleep apnea (adult) (pediatric): Secondary | ICD-10-CM | POA: Diagnosis not present

## 2023-11-27 DIAGNOSIS — G40409 Other generalized epilepsy and epileptic syndromes, not intractable, without status epilepticus: Secondary | ICD-10-CM | POA: Diagnosis not present

## 2023-11-27 DIAGNOSIS — E1122 Type 2 diabetes mellitus with diabetic chronic kidney disease: Secondary | ICD-10-CM | POA: Diagnosis not present

## 2023-11-30 DIAGNOSIS — I129 Hypertensive chronic kidney disease with stage 1 through stage 4 chronic kidney disease, or unspecified chronic kidney disease: Secondary | ICD-10-CM | POA: Diagnosis not present

## 2023-11-30 DIAGNOSIS — E1122 Type 2 diabetes mellitus with diabetic chronic kidney disease: Secondary | ICD-10-CM | POA: Diagnosis not present

## 2023-11-30 DIAGNOSIS — G4733 Obstructive sleep apnea (adult) (pediatric): Secondary | ICD-10-CM | POA: Diagnosis not present

## 2023-11-30 DIAGNOSIS — F01B18 Vascular dementia, moderate, with other behavioral disturbance: Secondary | ICD-10-CM | POA: Diagnosis not present

## 2023-11-30 DIAGNOSIS — N184 Chronic kidney disease, stage 4 (severe): Secondary | ICD-10-CM | POA: Diagnosis not present

## 2023-11-30 DIAGNOSIS — G40409 Other generalized epilepsy and epileptic syndromes, not intractable, without status epilepticus: Secondary | ICD-10-CM | POA: Diagnosis not present

## 2023-12-02 DIAGNOSIS — N184 Chronic kidney disease, stage 4 (severe): Secondary | ICD-10-CM | POA: Diagnosis not present

## 2023-12-02 DIAGNOSIS — I129 Hypertensive chronic kidney disease with stage 1 through stage 4 chronic kidney disease, or unspecified chronic kidney disease: Secondary | ICD-10-CM | POA: Diagnosis not present

## 2023-12-02 DIAGNOSIS — E1122 Type 2 diabetes mellitus with diabetic chronic kidney disease: Secondary | ICD-10-CM | POA: Diagnosis not present

## 2023-12-02 DIAGNOSIS — G40409 Other generalized epilepsy and epileptic syndromes, not intractable, without status epilepticus: Secondary | ICD-10-CM | POA: Diagnosis not present

## 2023-12-02 DIAGNOSIS — F01B18 Vascular dementia, moderate, with other behavioral disturbance: Secondary | ICD-10-CM | POA: Diagnosis not present

## 2023-12-02 DIAGNOSIS — G4733 Obstructive sleep apnea (adult) (pediatric): Secondary | ICD-10-CM | POA: Diagnosis not present

## 2023-12-04 DIAGNOSIS — G40409 Other generalized epilepsy and epileptic syndromes, not intractable, without status epilepticus: Secondary | ICD-10-CM | POA: Diagnosis not present

## 2023-12-04 DIAGNOSIS — I129 Hypertensive chronic kidney disease with stage 1 through stage 4 chronic kidney disease, or unspecified chronic kidney disease: Secondary | ICD-10-CM | POA: Diagnosis not present

## 2023-12-04 DIAGNOSIS — G4733 Obstructive sleep apnea (adult) (pediatric): Secondary | ICD-10-CM | POA: Diagnosis not present

## 2023-12-04 DIAGNOSIS — E1122 Type 2 diabetes mellitus with diabetic chronic kidney disease: Secondary | ICD-10-CM | POA: Diagnosis not present

## 2023-12-04 DIAGNOSIS — F01B18 Vascular dementia, moderate, with other behavioral disturbance: Secondary | ICD-10-CM | POA: Diagnosis not present

## 2023-12-04 DIAGNOSIS — N184 Chronic kidney disease, stage 4 (severe): Secondary | ICD-10-CM | POA: Diagnosis not present

## 2023-12-07 DIAGNOSIS — G4733 Obstructive sleep apnea (adult) (pediatric): Secondary | ICD-10-CM | POA: Diagnosis not present

## 2023-12-07 DIAGNOSIS — F01B18 Vascular dementia, moderate, with other behavioral disturbance: Secondary | ICD-10-CM | POA: Diagnosis not present

## 2023-12-07 DIAGNOSIS — I129 Hypertensive chronic kidney disease with stage 1 through stage 4 chronic kidney disease, or unspecified chronic kidney disease: Secondary | ICD-10-CM | POA: Diagnosis not present

## 2023-12-07 DIAGNOSIS — N184 Chronic kidney disease, stage 4 (severe): Secondary | ICD-10-CM | POA: Diagnosis not present

## 2023-12-07 DIAGNOSIS — E1122 Type 2 diabetes mellitus with diabetic chronic kidney disease: Secondary | ICD-10-CM | POA: Diagnosis not present

## 2023-12-07 DIAGNOSIS — G40409 Other generalized epilepsy and epileptic syndromes, not intractable, without status epilepticus: Secondary | ICD-10-CM | POA: Diagnosis not present

## 2023-12-09 DIAGNOSIS — G4733 Obstructive sleep apnea (adult) (pediatric): Secondary | ICD-10-CM | POA: Diagnosis not present

## 2023-12-09 DIAGNOSIS — N184 Chronic kidney disease, stage 4 (severe): Secondary | ICD-10-CM | POA: Diagnosis not present

## 2023-12-09 DIAGNOSIS — F01B18 Vascular dementia, moderate, with other behavioral disturbance: Secondary | ICD-10-CM | POA: Diagnosis not present

## 2023-12-09 DIAGNOSIS — E1122 Type 2 diabetes mellitus with diabetic chronic kidney disease: Secondary | ICD-10-CM | POA: Diagnosis not present

## 2023-12-09 DIAGNOSIS — I129 Hypertensive chronic kidney disease with stage 1 through stage 4 chronic kidney disease, or unspecified chronic kidney disease: Secondary | ICD-10-CM | POA: Diagnosis not present

## 2023-12-09 DIAGNOSIS — G40409 Other generalized epilepsy and epileptic syndromes, not intractable, without status epilepticus: Secondary | ICD-10-CM | POA: Diagnosis not present

## 2023-12-11 DIAGNOSIS — N184 Chronic kidney disease, stage 4 (severe): Secondary | ICD-10-CM | POA: Diagnosis not present

## 2023-12-11 DIAGNOSIS — J4489 Other specified chronic obstructive pulmonary disease: Secondary | ICD-10-CM | POA: Diagnosis not present

## 2023-12-11 DIAGNOSIS — E7800 Pure hypercholesterolemia, unspecified: Secondary | ICD-10-CM | POA: Diagnosis not present

## 2023-12-11 DIAGNOSIS — E663 Overweight: Secondary | ICD-10-CM | POA: Diagnosis not present

## 2023-12-11 DIAGNOSIS — E1122 Type 2 diabetes mellitus with diabetic chronic kidney disease: Secondary | ICD-10-CM | POA: Diagnosis not present

## 2023-12-11 DIAGNOSIS — Z556 Problems related to health literacy: Secondary | ICD-10-CM | POA: Diagnosis not present

## 2023-12-11 DIAGNOSIS — Z8673 Personal history of transient ischemic attack (TIA), and cerebral infarction without residual deficits: Secondary | ICD-10-CM | POA: Diagnosis not present

## 2023-12-11 DIAGNOSIS — I672 Cerebral atherosclerosis: Secondary | ICD-10-CM | POA: Diagnosis not present

## 2023-12-11 DIAGNOSIS — E039 Hypothyroidism, unspecified: Secondary | ICD-10-CM | POA: Diagnosis not present

## 2023-12-11 DIAGNOSIS — Z7982 Long term (current) use of aspirin: Secondary | ICD-10-CM | POA: Diagnosis not present

## 2023-12-11 DIAGNOSIS — Z7984 Long term (current) use of oral hypoglycemic drugs: Secondary | ICD-10-CM | POA: Diagnosis not present

## 2023-12-11 DIAGNOSIS — Z6828 Body mass index (BMI) 28.0-28.9, adult: Secondary | ICD-10-CM | POA: Diagnosis not present

## 2023-12-11 DIAGNOSIS — E538 Deficiency of other specified B group vitamins: Secondary | ICD-10-CM | POA: Diagnosis not present

## 2023-12-11 DIAGNOSIS — Z87442 Personal history of urinary calculi: Secondary | ICD-10-CM | POA: Diagnosis not present

## 2023-12-11 DIAGNOSIS — Z853 Personal history of malignant neoplasm of breast: Secondary | ICD-10-CM | POA: Diagnosis not present

## 2023-12-11 DIAGNOSIS — M810 Age-related osteoporosis without current pathological fracture: Secondary | ICD-10-CM | POA: Diagnosis not present

## 2023-12-11 DIAGNOSIS — M1990 Unspecified osteoarthritis, unspecified site: Secondary | ICD-10-CM | POA: Diagnosis not present

## 2023-12-11 DIAGNOSIS — K5790 Diverticulosis of intestine, part unspecified, without perforation or abscess without bleeding: Secondary | ICD-10-CM | POA: Diagnosis not present

## 2023-12-11 DIAGNOSIS — Z8542 Personal history of malignant neoplasm of other parts of uterus: Secondary | ICD-10-CM | POA: Diagnosis not present

## 2023-12-11 DIAGNOSIS — I129 Hypertensive chronic kidney disease with stage 1 through stage 4 chronic kidney disease, or unspecified chronic kidney disease: Secondary | ICD-10-CM | POA: Diagnosis not present

## 2023-12-11 DIAGNOSIS — F01B18 Vascular dementia, moderate, with other behavioral disturbance: Secondary | ICD-10-CM | POA: Diagnosis not present

## 2023-12-11 DIAGNOSIS — G40409 Other generalized epilepsy and epileptic syndromes, not intractable, without status epilepticus: Secondary | ICD-10-CM | POA: Diagnosis not present

## 2023-12-11 DIAGNOSIS — G4733 Obstructive sleep apnea (adult) (pediatric): Secondary | ICD-10-CM | POA: Diagnosis not present

## 2023-12-14 DIAGNOSIS — G40409 Other generalized epilepsy and epileptic syndromes, not intractable, without status epilepticus: Secondary | ICD-10-CM | POA: Diagnosis not present

## 2023-12-14 DIAGNOSIS — G4733 Obstructive sleep apnea (adult) (pediatric): Secondary | ICD-10-CM | POA: Diagnosis not present

## 2023-12-14 DIAGNOSIS — F01B18 Vascular dementia, moderate, with other behavioral disturbance: Secondary | ICD-10-CM | POA: Diagnosis not present

## 2023-12-14 DIAGNOSIS — I639 Cerebral infarction, unspecified: Secondary | ICD-10-CM | POA: Diagnosis not present

## 2023-12-14 DIAGNOSIS — N184 Chronic kidney disease, stage 4 (severe): Secondary | ICD-10-CM | POA: Diagnosis not present

## 2023-12-14 DIAGNOSIS — I129 Hypertensive chronic kidney disease with stage 1 through stage 4 chronic kidney disease, or unspecified chronic kidney disease: Secondary | ICD-10-CM | POA: Diagnosis not present

## 2023-12-14 DIAGNOSIS — E1122 Type 2 diabetes mellitus with diabetic chronic kidney disease: Secondary | ICD-10-CM | POA: Diagnosis not present

## 2023-12-14 DIAGNOSIS — R042 Hemoptysis: Secondary | ICD-10-CM | POA: Diagnosis not present

## 2023-12-21 ENCOUNTER — Telehealth: Payer: Self-pay | Admitting: Pharmacist

## 2023-12-21 NOTE — Progress Notes (Signed)
 Contacted patient regarding referral for medication management from Street, Renford Cartwright, MD .   Left patient a voicemail to return my call at their convenience  Calvert Caul, PharmD Clinical Pharmacist [place] Direct Dial: [phone]

## 2023-12-23 DIAGNOSIS — E1122 Type 2 diabetes mellitus with diabetic chronic kidney disease: Secondary | ICD-10-CM | POA: Diagnosis not present

## 2023-12-23 DIAGNOSIS — G4733 Obstructive sleep apnea (adult) (pediatric): Secondary | ICD-10-CM | POA: Diagnosis not present

## 2023-12-23 DIAGNOSIS — F01B18 Vascular dementia, moderate, with other behavioral disturbance: Secondary | ICD-10-CM | POA: Diagnosis not present

## 2023-12-23 DIAGNOSIS — I129 Hypertensive chronic kidney disease with stage 1 through stage 4 chronic kidney disease, or unspecified chronic kidney disease: Secondary | ICD-10-CM | POA: Diagnosis not present

## 2023-12-23 DIAGNOSIS — N184 Chronic kidney disease, stage 4 (severe): Secondary | ICD-10-CM | POA: Diagnosis not present

## 2023-12-23 DIAGNOSIS — G40409 Other generalized epilepsy and epileptic syndromes, not intractable, without status epilepticus: Secondary | ICD-10-CM | POA: Diagnosis not present

## 2023-12-24 ENCOUNTER — Telehealth: Payer: Self-pay | Admitting: Pharmacist

## 2023-12-24 NOTE — Progress Notes (Signed)
 Contacted patient regarding referral for medication management from Street, Renford Cartwright, MD .   Appointment scheduled   Calvert Caul, PharmD Clinical Pharmacist [place] Direct Dial: [phone]

## 2023-12-29 DIAGNOSIS — F01B18 Vascular dementia, moderate, with other behavioral disturbance: Secondary | ICD-10-CM | POA: Diagnosis not present

## 2023-12-29 DIAGNOSIS — G40409 Other generalized epilepsy and epileptic syndromes, not intractable, without status epilepticus: Secondary | ICD-10-CM | POA: Diagnosis not present

## 2023-12-29 DIAGNOSIS — E1122 Type 2 diabetes mellitus with diabetic chronic kidney disease: Secondary | ICD-10-CM | POA: Diagnosis not present

## 2023-12-29 DIAGNOSIS — I129 Hypertensive chronic kidney disease with stage 1 through stage 4 chronic kidney disease, or unspecified chronic kidney disease: Secondary | ICD-10-CM | POA: Diagnosis not present

## 2023-12-29 DIAGNOSIS — G4733 Obstructive sleep apnea (adult) (pediatric): Secondary | ICD-10-CM | POA: Diagnosis not present

## 2023-12-29 DIAGNOSIS — N184 Chronic kidney disease, stage 4 (severe): Secondary | ICD-10-CM | POA: Diagnosis not present

## 2024-01-04 ENCOUNTER — Other Ambulatory Visit: Payer: Self-pay | Admitting: Pharmacist

## 2024-01-04 DIAGNOSIS — G4733 Obstructive sleep apnea (adult) (pediatric): Secondary | ICD-10-CM | POA: Diagnosis not present

## 2024-01-04 DIAGNOSIS — F01B18 Vascular dementia, moderate, with other behavioral disturbance: Secondary | ICD-10-CM | POA: Diagnosis not present

## 2024-01-04 DIAGNOSIS — E1122 Type 2 diabetes mellitus with diabetic chronic kidney disease: Secondary | ICD-10-CM | POA: Diagnosis not present

## 2024-01-04 DIAGNOSIS — G40409 Other generalized epilepsy and epileptic syndromes, not intractable, without status epilepticus: Secondary | ICD-10-CM | POA: Diagnosis not present

## 2024-01-04 DIAGNOSIS — N184 Chronic kidney disease, stage 4 (severe): Secondary | ICD-10-CM | POA: Diagnosis not present

## 2024-01-04 DIAGNOSIS — I129 Hypertensive chronic kidney disease with stage 1 through stage 4 chronic kidney disease, or unspecified chronic kidney disease: Secondary | ICD-10-CM | POA: Diagnosis not present

## 2024-01-04 NOTE — Progress Notes (Signed)
   01/04/2024  Patient ID: Kathy Ho, female   DOB: 01-14-1941, 83 y.o.   MRN: [id number]  Patient's spouse presented to office today with patient's pill bottle and medication list. Bottles were reconciled with medication list in EMR.   Kathy Ho, PharmD Clinical Pharmacist [place] Direct Dial: [phone]

## 2024-01-19 DIAGNOSIS — J206 Acute bronchitis due to rhinovirus: Secondary | ICD-10-CM | POA: Diagnosis not present

## 2024-01-19 DIAGNOSIS — J069 Acute upper respiratory infection, unspecified: Secondary | ICD-10-CM | POA: Diagnosis not present

## 2024-01-19 DIAGNOSIS — J441 Chronic obstructive pulmonary disease with (acute) exacerbation: Secondary | ICD-10-CM | POA: Diagnosis not present

## 2024-01-25 ENCOUNTER — Telehealth: Payer: Self-pay

## 2024-01-25 NOTE — Transitions of Care (Post Inpatient/ED Visit) (Signed)
   01/25/2024  Name: Kathy Ho MRN: [id number] DOB: 03-28-1940  Today's TOC FU Call Status: Today's TOC FU Call Status:: Successful TOC FU Call Completed TOC FU Call Complete Date:  (Spoke with spouse who states patient has dementia - spouse asked purpose of call and states he thought the call was about home health - declined TOC call program - encouraged spouse to notify PCP if he does not hear from [facility] tomorrow - spouse agreeable)  Patient's Name and Date of Birth confirmed. DOB, Name  Shona Prow RN, CCM [place]  VBCI-Population Health RN Care Manager [phone]

## 2024-01-25 NOTE — Transitions of Care (Post Inpatient/ED Visit) (Signed)
   01/25/2024  Name: Kathy Ho MRN: [id number] DOB: 04/10/1940  Today's TOC FU Call Status: Today's TOC FU Call Status:: Unsuccessful Call (1st Attempt) Unsuccessful Call (1st Attempt) Date: 01/25/24  Attempted to reach the patient regarding the most recent Inpatient/ED visit.  Follow Up Plan: Additional outreach attempts will be made to reach the patient to complete the Transitions of Care (Post Inpatient/ED visit) call.   Shona Prow RN, CCM [place]  VBCI-Population Health RN Care Manager [phone]
